# Patient Record
Sex: Male | Born: 1945 | Race: White | Hispanic: No | Marital: Married | Smoking: Never smoker
Health system: Southern US, Community
[De-identification: ages and names within clinical notes are randomized; demographics above are authoritative.]

## PROBLEM LIST (undated history)

## (undated) DIAGNOSIS — H269 Unspecified cataract: Secondary | ICD-10-CM

## (undated) DIAGNOSIS — L723 Sebaceous cyst: Secondary | ICD-10-CM

## (undated) DIAGNOSIS — C801 Malignant (primary) neoplasm, unspecified: Secondary | ICD-10-CM

## (undated) HISTORY — DX: Malignant (primary) neoplasm, unspecified: C80.1

## (undated) HISTORY — PX: JOINT REPLACEMENT: SHX530

## (undated) HISTORY — DX: Unspecified cataract: H26.9

## (undated) HISTORY — PX: VASECTOMY: SHX75

## (undated) HISTORY — PX: QUADRICEPS REPAIR: SHX2281

## (undated) HISTORY — DX: Sebaceous cyst: L72.3

## (undated) HISTORY — PX: EYE SURGERY: SHX253

## (undated) HISTORY — PX: MOUTH SURGERY: SHX715

---

## 1992-04-22 HISTORY — PX: HERNIA REPAIR: SHX51

## 1993-04-22 HISTORY — PX: BUNIONECTOMY: SHX129

## 1997-08-11 ENCOUNTER — Ambulatory Visit (HOSPITAL_COMMUNITY): Admission: RE | Admit: 1997-08-11 | Discharge: 1997-08-11 | Payer: Self-pay | Admitting: Gastroenterology

## 2004-07-05 ENCOUNTER — Observation Stay (HOSPITAL_COMMUNITY): Admission: RE | Admit: 2004-07-05 | Discharge: 2004-07-06 | Payer: Self-pay | Admitting: Orthopedic Surgery

## 2008-04-22 HISTORY — PX: KNEE SURGERY: SHX244

## 2009-04-22 HISTORY — PX: OTHER SURGICAL HISTORY: SHX169

## 2009-05-04 ENCOUNTER — Ambulatory Visit (HOSPITAL_BASED_OUTPATIENT_CLINIC_OR_DEPARTMENT_OTHER): Admission: RE | Admit: 2009-05-04 | Discharge: 2009-05-04 | Payer: Self-pay | Admitting: Orthopedic Surgery

## 2010-07-08 LAB — POCT HEMOGLOBIN-HEMACUE: Hemoglobin: 15.1 g/dL (ref 13.0–17.0)
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
## 2010-12-07 ENCOUNTER — Other Ambulatory Visit: Payer: Self-pay | Admitting: Family Medicine

## 2010-12-07 DIAGNOSIS — R1031 Right lower quadrant pain: Secondary | ICD-10-CM

## 2010-12-28 ENCOUNTER — Ambulatory Visit (INDEPENDENT_AMBULATORY_CARE_PROVIDER_SITE_OTHER): Payer: Self-pay | Admitting: Surgery

## 2011-01-17 ENCOUNTER — Encounter (INDEPENDENT_AMBULATORY_CARE_PROVIDER_SITE_OTHER): Payer: Self-pay | Admitting: Surgery

## 2011-01-30 ENCOUNTER — Encounter (INDEPENDENT_AMBULATORY_CARE_PROVIDER_SITE_OTHER): Payer: Self-pay | Admitting: General Surgery

## 2011-01-30 ENCOUNTER — Ambulatory Visit (INDEPENDENT_AMBULATORY_CARE_PROVIDER_SITE_OTHER): Payer: Medicare Other | Admitting: General Surgery

## 2011-01-30 VITALS — BP 108/66 | HR 60 | Temp 97.6°F | Resp 12 | Ht 76.0 in | Wt 219.0 lb

## 2011-01-30 DIAGNOSIS — L723 Sebaceous cyst: Secondary | ICD-10-CM

## 2011-01-30 NOTE — Patient Instructions (Signed)
Call [phone] and ask for scheduling 

## 2011-01-30 NOTE — Progress Notes (Signed)
Subjective:     Patient ID: Gordon Ellis, male   DOB: 05/03/1945, 65 y.o.   MRN: [id number]  HPI Patient presents complaining of an enlarging mass above left elbow. He's had it for some time. He does have a history of other cysts around his body that have been removed in the past. This includes right thigh and other areas. He has some mild localized pain especially with sleeping. There were no overlying skin changes or drainage.  Review of Systems  Constitutional: Negative.   HENT: Negative.   Eyes: Negative.   Respiratory: Negative.   Genitourinary: Negative for difficulty urinating.  Musculoskeletal: Negative.   Neurological: Negative.   Hematological: Negative.   Psychiatric/Behavioral: Negative.   Genitourinary additions: Prostate cancer     Objective:   Physical Exam  Constitutional: He is oriented to person, place, and time. He appears well-developed and well-nourished.  HENT:  Head: Normocephalic and atraumatic.  Eyes: Conjunctivae are normal. Pupils are equal, round, and reactive to light. Right eye exhibits no discharge.  Neck: Normal range of motion. Neck supple. No tracheal deviation present.  Cardiovascular: Normal rate, regular rhythm and normal heart sounds.   No murmur heard. Pulmonary/Chest: Effort normal and breath sounds normal. No respiratory distress. He has no wheezes. He has no rales.  Abdominal: Soft. He exhibits no distension. There is no tenderness. There is no rebound and no guarding.  Musculoskeletal: Normal range of motion.  Neurological: He is alert and oriented to person, place, and time.  Left arm: Patient has a 2.5 cm subcutaneous mass proximal to his elbow. It is freely mobile from underlying tissues. However it is firm. There is no surrounding nodularity. There is no overlying skin change.     Assessment:     Mass left arm, likely sebaceous cyst    Plan:     I have offered elective removal under MAC anesthesia as an outpatient procedure.  Procedure risks & benefits were discussed in detail with the patient. I advised him he'll need to [employer] for 5 days before surgery. He'll also need to avoid heavy lifting for 2 weeks after surgery. He needs to complete a project and would like to call for  scheduling. We look forward to proceeding in the near future.

## 2012-04-16 ENCOUNTER — Encounter: Payer: Self-pay | Admitting: *Deleted

## 2012-04-16 DIAGNOSIS — C4491 Basal cell carcinoma of skin, unspecified: Secondary | ICD-10-CM

## 2012-07-08 ENCOUNTER — Telehealth: Payer: Self-pay

## 2012-07-08 NOTE — Telephone Encounter (Signed)
Dot- (anesthesia nurse)  from the [facility] has called to request last ov notes and any recent EKG's on Gordon Ellis dob: 2046/03/26. She said he is having surgery on Monday and needs these before then to review. Fax number is: [phone] Attn: Eunice Blase or Stanton Kidney.  [facility] phone number: [phone]

## 2012-07-09 NOTE — Telephone Encounter (Signed)
Patient last seen in 2012. Records faxed with confirmation.

## 2012-07-09 NOTE — Telephone Encounter (Signed)
Called again for same thing.  Debbies ext is [phone]

## 2012-12-22 ENCOUNTER — Ambulatory Visit (INDEPENDENT_AMBULATORY_CARE_PROVIDER_SITE_OTHER): Payer: Medicare Other | Admitting: Internal Medicine

## 2012-12-22 ENCOUNTER — Ambulatory Visit: Payer: Medicare Other

## 2012-12-22 VITALS — BP 120/70 | HR 65 | Temp 98.0°F | Resp 18 | Ht 77.0 in | Wt 220.0 lb

## 2012-12-22 DIAGNOSIS — Z23 Encounter for immunization: Secondary | ICD-10-CM

## 2012-12-22 DIAGNOSIS — M25559 Pain in unspecified hip: Secondary | ICD-10-CM

## 2012-12-22 DIAGNOSIS — S5001XA Contusion of right elbow, initial encounter: Secondary | ICD-10-CM

## 2012-12-22 DIAGNOSIS — S60229A Contusion of unspecified hand, initial encounter: Secondary | ICD-10-CM

## 2012-12-22 DIAGNOSIS — S60221A Contusion of right hand, initial encounter: Secondary | ICD-10-CM

## 2012-12-22 DIAGNOSIS — S79911A Unspecified injury of right hip, initial encounter: Secondary | ICD-10-CM

## 2012-12-22 DIAGNOSIS — M25549 Pain in joints of unspecified hand: Secondary | ICD-10-CM

## 2012-12-22 DIAGNOSIS — S51009A Unspecified open wound of unspecified elbow, initial encounter: Secondary | ICD-10-CM

## 2012-12-22 DIAGNOSIS — S5000XA Contusion of unspecified elbow, initial encounter: Secondary | ICD-10-CM

## 2012-12-22 DIAGNOSIS — S51002A Unspecified open wound of left elbow, initial encounter: Secondary | ICD-10-CM

## 2012-12-22 MED ORDER — AMOXICILLIN-POT CLAVULANATE 875-125 MG PO TABS: 1.0000 | ORAL_TABLET | Freq: Two times a day (BID) | ORAL | Status: DC

## 2012-12-22 MED ORDER — HYDROCODONE-ACETAMINOPHEN 5-325 MG PO TABS: 1.0000 | ORAL_TABLET | Freq: Four times a day (QID) | ORAL | Status: DC | PRN

## 2012-12-22 NOTE — Progress Notes (Signed)
Procedure Note: Verbal consent obtained from the patient.  Local anesthesia with 3 cc Lidocaine 2% with epinephrine.  Wound scrubbed with soap and water and then irrigated with a normal saline by 60cc syringe.  The wound was contaminated with grass and dirt but was cleaned thoroughly.  Wound explored.  No deep structure injury noted.  Wound closed with #2 deep sutures of 4-0 vicryl and #6 simple interrupted sutures of 4-0 ethilon.  The lateral most portion of the wound is deeply abraded and the edges cannot be approximated.  Xeroform gauze applied to this area.  Wound cleansed and dressed.  Discussed wound care.  Will have pt follow up with me in 48 hours, fast track card given.  Anticipate suture removal in 7-10 days.
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
## 2012-12-22 NOTE — Patient Instructions (Signed)

## 2012-12-24 ENCOUNTER — Ambulatory Visit (INDEPENDENT_AMBULATORY_CARE_PROVIDER_SITE_OTHER): Payer: Medicare Other | Admitting: Physician Assistant

## 2012-12-24 VITALS — BP 112/60 | HR 66 | Temp 98.5°F | Resp 18 | Wt 227.0 lb

## 2012-12-24 DIAGNOSIS — Z5189 Encounter for other specified aftercare: Secondary | ICD-10-CM

## 2012-12-24 DIAGNOSIS — S51001D Unspecified open wound of right elbow, subsequent encounter: Secondary | ICD-10-CM

## 2012-12-24 NOTE — Progress Notes (Signed)
  Subjective:    Patient ID: Ronn Melena, male    DOB: 12-23-1945, 67 y.o.   MRN: [id number]  HPI   Mr. Acklin is a very pleasant 67 yr old male here for wound check.  See procedure note from 12/22/12.  Pt reports the elbow is felling much better.  Still with some soreness and swelling.  Using ace wrap.  Iced yesterday but hasn't iced today yet   Review of Systems  All other systems reviewed and are negative.       Objective:   Physical Exam  Vitals reviewed. Constitutional: He is oriented to person, place, and time. He appears well-developed and well-nourished. No distress.  HENT:  Head: Normocephalic and atraumatic.  Pulmonary/Chest: Effort normal.  Neurological: He is alert and oriented to person, place, and time.  Skin: Skin is warm and dry.  Wound at right elbow much improved.  Sutures in place.  Abraded skin surrounds laceration. Some drainage on bandage.  Gently cleansed area.  Pulled suture tails out of epithelializing tissue.  Xeroform in place.  Wound edges well approximated.  Some maceration around edges of abrasion  Psychiatric: He has a normal mood and affect. His behavior is normal.        Assessment & Plan:  Wound, open, elbow, right, subsequent encounter   Mr. Mcglocklin is a very pleasant 67 yr old male here for wound check.  The wound looks very good today.  Encouraged pt to change dressing twice daily so that some of the maceration will resolve.  Apply wet cloth to xeroform twice daily to help loosen this.  Keep area covered.  Anticipate suture removal in about 7 days.  Will have pt follow up with me 01/01/13 - sooner if concerns.  Continue abx to cover for infection as wound was quite dirty.

## 2013-01-01 ENCOUNTER — Ambulatory Visit (INDEPENDENT_AMBULATORY_CARE_PROVIDER_SITE_OTHER): Payer: Medicare Other | Admitting: Physician Assistant

## 2013-01-01 VITALS — BP 110/60 | HR 68 | Temp 98.6°F | Resp 18 | Ht 77.0 in | Wt 220.0 lb

## 2013-01-01 DIAGNOSIS — Z4802 Encounter for removal of sutures: Secondary | ICD-10-CM

## 2013-01-01 NOTE — Progress Notes (Signed)
  Subjective:    Patient ID: Gordon Ellis, male    DOB: [birth year], 67 y.o.   MRN: [id number]  Suture / Staple Removal     Gordon Ellis is a very pleasant 67 yr old male here for removal of sutures placed here 10 days ago.  See previous notes for details.  Pt states he continues to improve.  Some tenderness of the area still.  Xeroform has been removed.  He has one dose of abx to complete.  Denies bleeding or drainage from the wound.  Continues to change the dressing BID.  Review of Systems  Skin: Positive for wound.  All other systems reviewed and are negative.       Objective:   Physical Exam  Vitals reviewed. Constitutional: He is oriented to person, place, and time. He appears well-developed and well-nourished.  HENT:  Head: Normocephalic and atraumatic.  Pulmonary/Chest: Effort normal.  Neurological: He is alert and oriented to person, place, and time.  Skin: Skin is warm and dry.     Healing wound at right elbow; sutures intact though some covered in scab/epithelial tissue; sutures soaked and tails gently removed;  Sutures removed with minimal difficulty; steri strips applied for extra support; abraded area healing well though still not completely scabbed over  Psychiatric: He has a normal mood and affect. His behavior is normal.        Assessment & Plan:  Visit for suture removal   Gordon Ellis is a very pleasant 67 yr old male here for suture removal.  Sutures removed as above.  Wound continues to heal very well, though I anticipate it will be several more weeks until completely healed due to extensive abraded skin.  Steristrips applied where sutures removed to provide extra support.  Continue daily non-stick dressing changes.  May use small amount of vaseline on gauze to prevent sticking.  Finish abx as directed.  Follow up if needed.

## 2013-04-08 ENCOUNTER — Ambulatory Visit (INDEPENDENT_AMBULATORY_CARE_PROVIDER_SITE_OTHER): Payer: Medicare Other | Admitting: Family Medicine

## 2013-04-08 ENCOUNTER — Encounter: Payer: Self-pay | Admitting: Family Medicine

## 2013-04-08 VITALS — BP 112/59 | HR 81 | Temp 97.6°F | Resp 16 | Ht 76.0 in | Wt 221.0 lb

## 2013-04-08 DIAGNOSIS — Z0000 Encounter for general adult medical examination without abnormal findings: Secondary | ICD-10-CM

## 2013-04-08 DIAGNOSIS — J309 Allergic rhinitis, unspecified: Secondary | ICD-10-CM

## 2013-04-08 DIAGNOSIS — Z9849 Cataract extraction status, unspecified eye: Secondary | ICD-10-CM

## 2013-04-08 DIAGNOSIS — R6889 Other general symptoms and signs: Secondary | ICD-10-CM

## 2013-04-08 DIAGNOSIS — C61 Malignant neoplasm of prostate: Secondary | ICD-10-CM

## 2013-04-08 LAB — COMPREHENSIVE METABOLIC PANEL
ALT: 18 U/L (ref 0–53)
AST: 23 U/L (ref 0–37)
Albumin: 4.3 g/dL (ref 3.5–5.2)
Alkaline Phosphatase: 57 U/L (ref 39–117)
BUN: 11 mg/dL (ref 6–23)
CO2: 28 mEq/L (ref 19–32)
Calcium: 9 mg/dL (ref 8.4–10.5)
Chloride: 102 mEq/L (ref 96–112)
Creat: 0.68 mg/dL (ref 0.50–1.35)
Glucose, Bld: 90 mg/dL (ref 70–99)
Potassium: 4.3 mEq/L (ref 3.5–5.3)
Sodium: 135 mEq/L (ref 135–145)
Total Bilirubin: 0.7 mg/dL (ref 0.3–1.2)
Total Protein: 6 g/dL (ref 6.0–8.3)

## 2013-04-08 LAB — LIPID PANEL
Cholesterol: 185 mg/dL (ref 0–200)
HDL: 63 mg/dL (ref >39)
LDL Cholesterol: 107 mg/dL — ABNORMAL HIGH (ref 0–99)
Total CHOL/HDL Ratio: 2.9 Ratio
Triglycerides: 73 mg/dL (ref <150)
VLDL: 15 mg/dL (ref 0–40)

## 2013-04-08 LAB — CBC
HCT: 39.9 % (ref 39.0–52.0)
Hemoglobin: 13.4 g/dL (ref 13.0–17.0)
MCH: 30.2 pg (ref 26.0–34.0)
MCHC: 33.6 g/dL (ref 30.0–36.0)
MCV: 90.1 fL (ref 78.0–100.0)
Platelets: 187 10*3/uL (ref 150–400)
RBC: 4.43 MIL/uL (ref 4.22–5.81)
RDW: 13.5 % (ref 11.5–15.5)
WBC: 4.7 10*3/uL (ref 4.0–10.5)

## 2013-04-08 LAB — TSH: TSH: 1.66 u[IU]/mL (ref 0.350–4.500)

## 2013-04-08 MED ORDER — FLUTICASONE PROPIONATE 50 MCG/ACT NA SUSP: 2.0000 | Freq: Every day | NASAL | Status: DC

## 2013-04-08 NOTE — Patient Instructions (Addendum)
Elastic Therapy. [phone],  (or [phone]) Medium Compression 20-30  Open toe or closed toe  For twitching legs Try Magnesium supplement at night If this doesn't work, you can often get relief with iron supplement (used for restless legs) 325 mg once a day If that doesn't work, let me know and I can write a prescription.
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
## 2013-04-09 LAB — VITAMIN D 25 HYDROXY (VIT D DEFICIENCY, FRACTURES): Vit D, 25-Hydroxy: 36 ng/mL (ref 30–89)

## 2013-09-01 ENCOUNTER — Telehealth: Payer: Self-pay

## 2013-09-01 NOTE — Telephone Encounter (Addendum)
Patient called and left a message 08/31/13 requesting a copy of his lab results from his CPE done on 04/08/13. I returned call 09/01/13 and advised him that he can pick up his records now that I have completed the release and sign a release form when he comes to get them.

## 2014-03-21 ENCOUNTER — Ambulatory Visit: Payer: 59

## 2014-03-21 ENCOUNTER — Ambulatory Visit (INDEPENDENT_AMBULATORY_CARE_PROVIDER_SITE_OTHER): Payer: 59

## 2014-03-21 DIAGNOSIS — Z23 Encounter for immunization: Secondary | ICD-10-CM

## 2014-03-29 ENCOUNTER — Encounter: Payer: Self-pay | Admitting: *Deleted

## 2014-03-31 ENCOUNTER — Telehealth: Payer: Self-pay | Admitting: *Deleted

## 2014-03-31 NOTE — Telephone Encounter (Signed)
LMOM for pt to come in to receive flu shot.

## 2014-05-02 ENCOUNTER — Telehealth: Payer: Self-pay | Admitting: *Deleted

## 2014-05-02 NOTE — Telephone Encounter (Signed)
Patient received flu shot on 03/21/14 in the office.

## 2014-05-05 ENCOUNTER — Ambulatory Visit (INDEPENDENT_AMBULATORY_CARE_PROVIDER_SITE_OTHER): Payer: Medicare Other | Admitting: Family Medicine

## 2014-05-05 ENCOUNTER — Encounter: Payer: Self-pay | Admitting: Family Medicine

## 2014-05-05 VITALS — BP 109/65 | HR 65 | Temp 98.5°F | Resp 16 | Ht 76.0 in | Wt 217.0 lb

## 2014-05-05 DIAGNOSIS — Z0000 Encounter for general adult medical examination without abnormal findings: Secondary | ICD-10-CM

## 2014-05-05 DIAGNOSIS — H53459 Other localized visual field defect, unspecified eye: Secondary | ICD-10-CM

## 2014-05-05 DIAGNOSIS — J3089 Other allergic rhinitis: Secondary | ICD-10-CM

## 2014-05-05 DIAGNOSIS — Z23 Encounter for immunization: Secondary | ICD-10-CM

## 2014-05-05 DIAGNOSIS — R6889 Other general symptoms and signs: Secondary | ICD-10-CM

## 2014-05-05 DIAGNOSIS — H53419 Scotoma involving central area, unspecified eye: Secondary | ICD-10-CM

## 2014-05-05 LAB — COMPREHENSIVE METABOLIC PANEL
ALT: 19 U/L (ref 0–53)
AST: 23 U/L (ref 0–37)
Albumin: 4.2 g/dL (ref 3.5–5.2)
Alkaline Phosphatase: 48 U/L (ref 39–117)
BUN: 12 mg/dL (ref 6–23)
CO2: 28 mEq/L (ref 19–32)
Calcium: 8.9 mg/dL (ref 8.4–10.5)
Chloride: 102 mEq/L (ref 96–112)
Creat: 0.63 mg/dL (ref 0.50–1.35)
Glucose, Bld: 85 mg/dL (ref 70–99)
Potassium: 4.5 mEq/L (ref 3.5–5.3)
Sodium: 137 mEq/L (ref 135–145)
Total Bilirubin: 0.7 mg/dL (ref 0.2–1.2)
Total Protein: 6.5 g/dL (ref 6.0–8.3)

## 2014-05-05 LAB — POCT URINALYSIS DIPSTICK
Bilirubin, UA: NEGATIVE
Blood, UA: NEGATIVE
Glucose, UA: NEGATIVE
Ketones, UA: NEGATIVE
Leukocytes, UA: NEGATIVE
Nitrite, UA: NEGATIVE
Protein, UA: NEGATIVE
Spec Grav, UA: 1.015
Urobilinogen, UA: 0.2
pH, UA: 7.5

## 2014-05-05 LAB — LIPID PANEL
Cholesterol: 205 mg/dL — ABNORMAL HIGH (ref 0–200)
HDL: 63 mg/dL (ref >39)
LDL Cholesterol: 124 mg/dL — ABNORMAL HIGH (ref 0–99)
Total CHOL/HDL Ratio: 3.3 Ratio
Triglycerides: 88 mg/dL (ref <150)
VLDL: 18 mg/dL (ref 0–40)

## 2014-05-05 MED ORDER — PNEUMOCOCCAL 13-VAL CONJ VACC IM SUSP: 0.5000 mL | INTRAMUSCULAR | Status: DC

## 2014-05-05 MED ORDER — IPRATROPIUM BROMIDE 0.03 % NA SOLN: 2.0000 | Freq: Two times a day (BID) | NASAL | Status: DC

## 2014-05-05 NOTE — Patient Instructions (Signed)
Elastics therapy  [id number]   Health Maintenance A healthy lifestyle and preventative care can promote health and wellness.  Maintain regular health, dental, and eye exams.  Eat a healthy diet. Foods like vegetables, fruits, whole grains, low-fat dairy products, and lean protein foods contain the nutrients you need and are low in calories. Decrease your intake of foods high in solid fats, added sugars, and salt. Get information about a proper diet from your health care provider, if necessary.  Regular physical exercise is one of the most important things you can do for your health. Most adults should get at least 150 minutes of moderate-intensity exercise (any activity that increases your heart rate and causes you to sweat) each week. In addition, most adults need muscle-strengthening exercises on 2 or more days a week.   Maintain a healthy weight. The body mass index (BMI) is a screening tool to identify possible weight problems. It provides an estimate of body fat based on height and weight. Your health care provider can find your BMI and can help you achieve or maintain a healthy weight. For males 20 years and older:  A BMI below 18.5 is considered underweight.  A BMI of 18.5 to 24.9 is normal.  A BMI of 25 to 29.9 is considered overweight.  A BMI of 30 and above is considered obese.  Maintain normal blood lipids and cholesterol by exercising and minimizing your intake of saturated fat. Eat a balanced diet with plenty of fruits and vegetables. Blood tests for lipids and cholesterol should begin at age 75 and be repeated every 5 years. If your lipid or cholesterol levels are high, you are over age 64, or you are at high risk for heart disease, you may need your cholesterol levels checked more frequently.Ongoing high lipid and cholesterol levels should be treated with medicines if diet and exercise are not working.  If you smoke, find out from your health care provider how to quit. If you  do not use tobacco, do not start.  Lung cancer screening is recommended for adults aged 59-80 years who are at high risk for developing lung cancer because of a history of smoking. A yearly low-dose CT scan of the lungs is recommended for people who have at least a 30-pack-year history of smoking and are current smokers or have quit within the past 15 years. A pack year of smoking is smoking an average of 1 pack of cigarettes a day for 1 year (for example, a 30-pack-year history of smoking could mean smoking 1 pack a day for 30 years or 2 packs a day for 15 years). Yearly screening should continue until the smoker has stopped smoking for at least 15 years. Yearly screening should be stopped for people who develop a health problem that would prevent them from having lung cancer treatment.  If you choose to drink alcohol, do not have more than 2 drinks per day. One drink is considered to be 12 oz (360 mL) of beer, 5 oz (150 mL) of wine, or 1.5 oz (45 mL) of liquor.  Avoid the use of street drugs. Do not share needles with anyone. Ask for help if you need support or instructions about stopping the use of drugs.  High blood pressure causes heart disease and increases the risk of stroke. Blood pressure should be checked at least every 1-2 years. Ongoing high blood pressure should be treated with medicines if weight loss and exercise are not effective.  If you are 96-58 years old,  ask your health care provider if you should take aspirin to prevent heart disease.  Diabetes screening involves taking a blood sample to check your fasting blood sugar level. This should be done once every 3 years after age 45 if you are at a normal weight and without risk factors for diabetes. Testing should be considered at a younger age or be carried out more frequently if you are overweight and have at least 1 risk factor for diabetes.  Colorectal cancer can be detected and often prevented. Most routine colorectal cancer  screening begins at the age of 50 and continues through age 75. However, your health care provider may recommend screening at an earlier age if you have risk factors for colon cancer. On a yearly basis, your health care provider may provide home test kits to check for hidden blood in the stool. A small camera at the end of a tube may be used to directly examine the colon (sigmoidoscopy or colonoscopy) to detect the earliest forms of colorectal cancer. Talk to your health care provider about this at age 50 when routine screening begins. A direct exam of the colon should be repeated every 5-10 years through age 75, unless early forms of precancerous polyps or small growths are found.  People who are at an increased risk for hepatitis B should be screened for this virus. You are considered at high risk for hepatitis B if:  You were born in a country where hepatitis B occurs often. Talk with your health care provider about which countries are considered high risk.  Your parents were born in a high-risk country and you have not received a shot to protect against hepatitis B (hepatitis B vaccine).  You have HIV or AIDS.  You use needles to inject street drugs.  You live with, or have sex with, someone who has hepatitis B.  You are a man who has sex with other men (MSM).  You get hemodialysis treatment.  You take certain medicines for conditions like cancer, organ transplantation, and autoimmune conditions.  Hepatitis C blood testing is recommended for all people born from 1945 through 1965 and any individual with known risk factors for hepatitis C.  Healthy men should no longer receive prostate-specific antigen (PSA) blood tests as part of routine cancer screening. Talk to your health care provider about prostate cancer screening.  Testicular cancer screening is not recommended for adolescents or adult males who have no symptoms. Screening includes self-exam, a health care provider exam, and other  screening tests. Consult with your health care provider about any symptoms you have or any concerns you have about testicular cancer.  Practice safe sex. Use condoms and avoid high-risk sexual practices to reduce the spread of sexually transmitted infections (STIs).  You should be screened for STIs, including gonorrhea and chlamydia if:  You are sexually active and are younger than 24 years.  You are older than 24 years, and your health care provider tells you that you are at risk for this type of infection.  Your sexual activity has changed since you were last screened, and you are at an increased risk for chlamydia or gonorrhea. Ask your health care provider if you are at risk.  If you are at risk of being infected with HIV, it is recommended that you take a prescription medicine daily to prevent HIV infection. This is called pre-exposure prophylaxis (PrEP). You are considered at risk if:  You are a man who has sex with other men (MSM).    You are a heterosexual man who is sexually active with multiple partners.  You take drugs by injection.  You are sexually active with a partner who has HIV.  Talk with your health care provider about whether you are at high risk of being infected with HIV. If you choose to begin PrEP, you should first be tested for HIV. You should then be tested every 3 months for as long as you are taking PrEP.  Use sunscreen. Apply sunscreen liberally and repeatedly throughout the day. You should seek shade when your shadow is shorter than you. Protect yourself by wearing long sleeves, pants, a wide-brimmed hat, and sunglasses year round whenever you are outdoors.  Tell your health care provider of new moles or changes in moles, especially if there is a change in shape or color. Also, tell your health care provider if a mole is larger than the size of a pencil eraser.  A one-time screening for abdominal aortic aneurysm (AAA) and surgical repair of large AAAs by  ultrasound is recommended for men aged 19-75 years who are current or former smokers.  Stay current with your vaccines (immunizations). Document Released: 10/05/2007 Document Revised: 04/13/2013 Document Reviewed: 09/03/2010 [facility] Patient Information 2015 [place], [place]. This information is not intended to replace advice given to you by your health care provider. Make sure you discuss any questions you have with your health care provider.

## 2014-05-05 NOTE — Progress Notes (Addendum)
Subjective:  This chart was scribed for [name], MD by Donato Schultz, Medical Scribe. This patient was seen in Room 26 and the patient's care was started at 2:32 PM.   Patient ID: Gordon Ellis, male    DOB: 12-Aug-1945, 69 y.o.   MRN: [id number]  HPI HPI Comments: Gordon Ellis is a 69 y.o. male who presents to the [facility] for an annual exam.  He is complaining of cold intolerance.  He is also experiencing intermittent lightheadedness that occurs when he is sitting down watching television.  He states that his vision becomes pixilated during these episodes.  He is also complaining of constant rhinorrhea and nasal congestion.  He has tried using Flonase with no relief to his symptoms.  His PSA has been low.  His last prostate exam was in November and it was normal.  He sees a dermatologist every 6 months.  Dr. Baldwin Jamaica did his cataract surgery and lens implants.  He had a colonoscopy in 2009 and a Tdap in 2014.  He still ride shis bike for exercise.  He would like to receive the Prevnar vaccine.  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    Patient Active Problem List   Diagnosis Date Noted  . Cataract extraction status 04/08/2013  . Prostate ca 04/08/2013  . Basal cell carcinoma 04/16/2012   Past Medical History  Diagnosis Date  . Cancer     prostate  . Sebaceous cyst     left arm   Past Surgical History  Procedure Laterality Date  . Knee surgery  2010    right  . Thumb surgery  2011    left  . Mouth surgery    . Bunionectomy  1995    left  . Hernia repair  1994    [facility]   No Known Allergies Prior to Admission medications   Medication Sig Start Date End Date Taking? Authorizing Provider  finasteride (PROSCAR) 5 MG tablet daily. 01/28/11  Yes Historical Provider, MD  fluticasone (FLONASE) 50 MCG/ACT nasal spray Place 2 sprays into both nostrils daily. Patient not taking:  Reported on 05/05/2014 04/08/13   [name], MD   History   Social History  . Marital Status: Married    Spouse Name: N/A    Number of Children: N/A  . Years of Education: N/A   Occupational History  . Not on file.   Social History Main Topics  . Smoking status: Never Smoker   . Smokeless tobacco: Not on file  . Alcohol Use: Yes  . Drug Use: No  . Sexual Activity: Not on file   Other Topics Concern  . Not on file   Social History Narrative     Review of Systems  HENT: Positive for congestion and rhinorrhea.   Neurological: Positive for light-headedness.  All other systems reviewed and are negative.    Objective:  Physical Exam  Constitutional: He is oriented to person, place, and time. He appears well-developed and well-nourished.  HENT:  Head: Normocephalic and atraumatic.  Right Ear: External ear normal.  Left Ear: External ear normal.  Mouth/Throat: Oropharynx is clear and moist. No oropharyngeal exudate.  Eyes: Conjunctivae and EOM are normal. Pupils are equal, round, and reactive to light. Right eye exhibits no discharge. Left eye exhibits no discharge. No scleral icterus.  Neck: Normal range of motion. Neck supple. No thyromegaly present.  Cardiovascular:  Normal rate, regular rhythm and normal heart sounds.  Exam reveals no gallop and no friction rub.   No murmur heard. Pulmonary/Chest: Effort normal and breath sounds normal. No respiratory distress. He has no wheezes. He has no rales.  Abdominal: Soft. Bowel sounds are normal. He exhibits no distension and no mass. There is no tenderness. There is no rebound and no guarding.  Musculoskeletal: Normal range of motion.  Lymphadenopathy:    He has no cervical adenopathy.  Neurological: He is alert and oriented to person, place, and time.  Skin: Skin is warm and dry.  Psychiatric: He has a normal mood and affect. His behavior is normal.  Nursing note and vitals reviewed.  Skin:  Multiple seb keratoses 2  skin lesions:  Right scapula and left inner helix, cryo'd Results for orders placed or performed in visit on 05/05/14  POCT urinalysis dipstick  Result Value Ref Range   Color, UA YELLOW    Clarity, UA CLEAR    Glucose, UA NEG    Bilirubin, UA NEG    Ketones, UA NEG    Spec Grav, UA 1.015    Blood, UA NEG    pH, UA 7.5    Protein, UA NEG    Urobilinogen, UA 0.2    Nitrite, UA NEG    Leukocytes, UA Negative     BP 109/65 mmHg  Pulse 65  Temp(Src) 98.5 F (36.9 C)  Resp 16  Ht 6\' 4"  (1.93 m)  Wt 217 lb (98.431 kg)  BMI 26.43 kg/m2  SpO2 96% Assessment & Plan:   This chart was scribed in my presence and reviewed by me personally.    ICD-9-CM ICD-10-CM   1. Annual physical exam V70.0 Z00.00 CBC with Differential     TSH     Lipid panel     Comprehensive metabolic panel     POCT urinalysis dipstick     pneumococcal 13-valent conjugate vaccine (PREVNAR 13) injection 0.5 mL     Sedimentation rate     CANCELED: Vit D  25 hydroxy (rtn osteoporosis monitoring)  2. Other allergic rhinitis 477.8 J30.89 ipratropium (ATROVENT) 0.03 % nasal spray  3. Cold intolerance 780.99 R68.89 TSH  4. [place], unspecified laterality 368.44 H53.459 Sedimentation rate  5. Need for prophylactic vaccination against Streptococcus pneumoniae (pneumococcus) V03.82 Z23 Pneumococcal conjugate vaccine 13-valent IM    Signed, [name], MD

## 2014-05-05 NOTE — Progress Notes (Signed)
   Subjective:    Patient ID: Gordon Ellis, male    DOB: [birth year], 69 y.o.   MRN: [id number]  HPI    Review of Systems  HENT: Positive for congestion, postnasal drip and rhinorrhea.   Cardiovascular: Positive for leg swelling.  Endocrine: Positive for cold intolerance.  Genitourinary: Positive for frequency.  Neurological: Positive for light-headedness.  Psychiatric/Behavioral: The patient is nervous/anxious.        Objective:   Physical Exam        Assessment & Plan:

## 2014-05-06 LAB — CBC WITH DIFFERENTIAL/PLATELET
Basophils Absolute: 0 10*3/uL (ref 0.0–0.1)
Basophils Relative: 1 % (ref 0–1)
Eosinophils Absolute: 0.1 10*3/uL (ref 0.0–0.7)
Eosinophils Relative: 2 % (ref 0–5)
HCT: 43.7 % (ref 39.0–52.0)
Hemoglobin: 14.6 g/dL (ref 13.0–17.0)
Lymphocytes Relative: 38 % (ref 12–46)
Lymphs Abs: 1.7 10*3/uL (ref 0.7–4.0)
MCH: 31.1 pg (ref 26.0–34.0)
MCHC: 33.4 g/dL (ref 30.0–36.0)
MCV: 93.2 fL (ref 78.0–100.0)
MPV: 10.2 fL (ref 8.6–12.4)
Monocytes Absolute: 0.4 10*3/uL (ref 0.1–1.0)
Monocytes Relative: 8 % (ref 3–12)
Neutro Abs: 2.3 10*3/uL (ref 1.7–7.7)
Neutrophils Relative %: 51 % (ref 43–77)
Platelets: 183 10*3/uL (ref 150–400)
RBC: 4.69 MIL/uL (ref 4.22–5.81)
RDW: 13.7 % (ref 11.5–15.5)
WBC: 4.6 10*3/uL (ref 4.0–10.5)

## 2014-05-06 LAB — TSH: TSH: 1.401 u[IU]/mL (ref 0.350–4.500)

## 2014-05-07 LAB — SEDIMENTATION RATE: Sed Rate: 1 mm/hr (ref 0–16)

## 2014-10-05 DIAGNOSIS — N400 Enlarged prostate without lower urinary tract symptoms: Secondary | ICD-10-CM | POA: Insufficient documentation

## 2014-10-05 DIAGNOSIS — N529 Male erectile dysfunction, unspecified: Secondary | ICD-10-CM | POA: Insufficient documentation

## 2015-01-05 ENCOUNTER — Encounter (HOSPITAL_COMMUNITY): Payer: Self-pay | Admitting: *Deleted

## 2015-01-05 DIAGNOSIS — R3916 Straining to void: Secondary | ICD-10-CM

## 2015-01-05 DIAGNOSIS — C61 Malignant neoplasm of prostate: Secondary | ICD-10-CM

## 2015-01-05 DIAGNOSIS — N401 Enlarged prostate with lower urinary tract symptoms: Secondary | ICD-10-CM

## 2015-01-05 NOTE — Assessment & Plan Note (Signed)
[facility] Raynelle Bring, MD 10/05/14  No evidence of clinical progression.  Follow-up in 6 months.  We will then plan to schedule his next surveillance protocol biopsy shortly after his next appointment.

## 2015-09-05 ENCOUNTER — Telehealth: Payer: Self-pay | Admitting: Family Medicine

## 2015-09-05 NOTE — Telephone Encounter (Signed)
SPOKE WITH PATIENT AND HE WILL BE COMING IN ON 02/27/16 FOR A CPE WITH CHELLE JEFFERIES.

## 2015-09-05 NOTE — Telephone Encounter (Signed)
Left a message for patient to call and schedule a CPE

## 2015-11-09 ENCOUNTER — Other Ambulatory Visit: Payer: Self-pay | Admitting: Urology

## 2015-11-09 DIAGNOSIS — C61 Malignant neoplasm of prostate: Secondary | ICD-10-CM

## 2015-11-20 ENCOUNTER — Encounter: Payer: Self-pay | Admitting: Physician Assistant

## 2015-12-05 ENCOUNTER — Other Ambulatory Visit: Payer: Self-pay

## 2016-01-22 DIAGNOSIS — R351 Nocturia: Secondary | ICD-10-CM | POA: Insufficient documentation

## 2016-01-22 DIAGNOSIS — R3915 Urgency of urination: Secondary | ICD-10-CM | POA: Insufficient documentation

## 2016-02-27 ENCOUNTER — Ambulatory Visit (INDEPENDENT_AMBULATORY_CARE_PROVIDER_SITE_OTHER): Payer: Medicare Other | Admitting: Physician Assistant

## 2016-02-27 ENCOUNTER — Encounter: Payer: Self-pay | Admitting: Physician Assistant

## 2016-02-27 VITALS — BP 120/60 | HR 60 | Temp 98.0°F | Resp 16 | Ht 76.0 in | Wt 216.2 lb

## 2016-02-27 DIAGNOSIS — R6889 Other general symptoms and signs: Secondary | ICD-10-CM

## 2016-02-27 DIAGNOSIS — Z136 Encounter for screening for cardiovascular disorders: Secondary | ICD-10-CM

## 2016-02-27 DIAGNOSIS — C61 Malignant neoplasm of prostate: Secondary | ICD-10-CM

## 2016-02-27 DIAGNOSIS — Z0000 Encounter for general adult medical examination without abnormal findings: Secondary | ICD-10-CM | POA: Diagnosis not present

## 2016-02-27 DIAGNOSIS — Z23 Encounter for immunization: Secondary | ICD-10-CM

## 2016-02-27 DIAGNOSIS — N401 Enlarged prostate with lower urinary tract symptoms: Secondary | ICD-10-CM

## 2016-02-27 DIAGNOSIS — E785 Hyperlipidemia, unspecified: Secondary | ICD-10-CM

## 2016-02-27 DIAGNOSIS — Z1159 Encounter for screening for other viral diseases: Secondary | ICD-10-CM

## 2016-02-27 DIAGNOSIS — Z131 Encounter for screening for diabetes mellitus: Secondary | ICD-10-CM

## 2016-02-27 DIAGNOSIS — R351 Nocturia: Secondary | ICD-10-CM

## 2016-02-27 LAB — COMPREHENSIVE METABOLIC PANEL
ALK PHOS: 46 U/L (ref 40–115)
ALT: 20 U/L (ref 9–46)
AST: 26 U/L (ref 10–35)
Albumin: 4.4 g/dL (ref 3.6–5.1)
BILIRUBIN TOTAL: 0.7 mg/dL (ref 0.2–1.2)
BUN: 14 mg/dL (ref 7–25)
CO2: 27 mmol/L (ref 20–31)
CREATININE: 0.83 mg/dL (ref 0.70–1.18)
Calcium: 9.5 mg/dL (ref 8.6–10.3)
Chloride: 103 mmol/L (ref 98–110)
Glucose, Bld: 96 mg/dL (ref 65–99)
Potassium: 4.6 mmol/L (ref 3.5–5.3)
SODIUM: 138 mmol/L (ref 135–146)
TOTAL PROTEIN: 6.4 g/dL (ref 6.1–8.1)

## 2016-02-27 LAB — LIPID PANEL
CHOLESTEROL: 220 mg/dL — AB (ref <200)
HDL: 79 mg/dL (ref >40)
LDL Cholesterol: 124 mg/dL — ABNORMAL HIGH
Total CHOL/HDL Ratio: 2.8 Ratio (ref <5.0)
Triglycerides: 86 mg/dL (ref <150)
VLDL: 17 mg/dL (ref <30)

## 2016-02-27 LAB — CBC WITH DIFFERENTIAL/PLATELET
BASOS PCT: 1 %
Basophils Absolute: 58 cells/uL (ref 0–200)
EOS ABS: 58 {cells}/uL (ref 15–500)
Eosinophils Relative: 1 %
HEMATOCRIT: 42.3 % (ref 38.5–50.0)
Hemoglobin: 14.1 g/dL (ref 13.2–17.1)
LYMPHS ABS: 2378 {cells}/uL (ref 850–3900)
Lymphocytes Relative: 41 %
MCH: 31.2 pg (ref 27.0–33.0)
MCHC: 33.3 g/dL (ref 32.0–36.0)
MCV: 93.6 fL (ref 80.0–100.0)
MONO ABS: 580 {cells}/uL (ref 200–950)
MONOS PCT: 10 %
MPV: 10.2 fL (ref 7.5–12.5)
NEUTROS ABS: 2726 {cells}/uL (ref 1500–7800)
Neutrophils Relative %: 47 %
PLATELETS: 180 10*3/uL (ref 140–400)
RBC: 4.52 MIL/uL (ref 4.20–5.80)
RDW: 13.4 % (ref 11.0–15.0)
WBC: 5.8 10*3/uL (ref 3.8–10.8)

## 2016-02-27 LAB — TSH: TSH: 1.11 mIU/L (ref 0.40–4.50)

## 2016-02-27 NOTE — Patient Instructions (Addendum)
IF you received an x-ray today, you will receive an invoice from [facility]. Please contact [facility] at [phone] with questions or concerns regarding your invoice.   IF you received labwork today, you will receive an invoice from [employer]. Please contact [facility] at [phone] with questions or concerns regarding your invoice.   Our billing staff will not be able to assist you with questions regarding bills from these companies.  You will be contacted with the lab results as soon as they are available. The fastest way to get your results is to activate your My Chart account. Instructions are located on the last page of this paperwork. If you have not heard from [place] regarding the results in 2 weeks, please contact this office.     Keeping you healthy  Get these tests  Blood pressure- Have your blood pressure checked once a year by your healthcare provider.  Normal blood pressure is 120/80  Weight- Have your body mass index (BMI) calculated to screen for obesity.  BMI is a measure of body fat based on height and weight. You can also calculate your own BMI at ViewBanking.si.  Cholesterol- Have your cholesterol checked every year.  Diabetes- Have your blood sugar checked regularly if you have high blood pressure, high cholesterol, have a family history of diabetes or if you are overweight.  Screening for Colon Cancer- Colonoscopy starting at age 55.  Screening may begin sooner depending on your family history and other health conditions. Follow up colonoscopy as directed by your Gastroenterologist.  Screening for Prostate Cancer- Both blood work (PSA) and a rectal exam help screen for Prostate Cancer.  Screening begins at age 64 with African-American men and at age 6 with Caucasian men.  Screening may begin sooner depending on your family history.  Take these medicines  Aspirin- One aspirin daily can help prevent Heart  disease and Stroke.  Flu shot- Every fall.  Tetanus- Every 10 years.  Zostavax- Once after the age of 71 to prevent Shingles.  Pneumonia shot- Once after the age of 47; if you are younger than 70, ask your healthcare provider if you need a Pneumonia shot.  Take these steps  Don't smoke- If you do smoke, talk to your doctor about quitting.  For tips on how to quit, go to www.smokefree.gov or call 1-800-QUIT-NOW.  Be physically active- Exercise 5 days a week for at least 30 minutes.  If you are not already physically active start slow and gradually work up to 30 minutes of moderate physical activity.  Examples of moderate activity include walking briskly, mowing the yard, dancing, swimming, bicycling, etc.  Eat a healthy diet- Eat a variety of healthy food such as fruits, vegetables, low fat milk, low fat cheese, yogurt, lean meant, poultry, fish, beans, tofu, etc. For more information go to www.thenutritionsource.org  Drink alcohol in moderation- Limit alcohol intake to less than two drinks a day. Never drink and drive.  Dentist- Brush and floss twice daily; visit your dentist twice a year.  Depression- Your emotional health is as important as your physical health. If you're feeling down, or losing interest in things you would normally enjoy please talk to your healthcare provider.  Eye exam- Visit your eye doctor every year.  Safe sex- If you may be exposed to a sexually transmitted infection, use a condom.  Seat belts- Seat belts can save your life; always wear one.  Smoke/Carbon Monoxide detectors- These detectors need to be installed  on the appropriate level of your home.  Replace batteries at least once a year.  Skin cancer- When out in the sun, cover up and use sunscreen 15 SPF or higher.  Violence- If anyone is threatening you, please tell your healthcare provider.  Living Will/ Health care power of attorney- Speak with your healthcare provider and family.    Influenza  (Flu) Vaccine (Inactivated or Recombinant):  1. Why get vaccinated? Influenza ("flu") is a contagious disease that spreads around the [place] every year, usually between October and May. Flu is caused by influenza viruses, and is spread mainly by coughing, sneezing, and close contact. Anyone can get flu. Flu strikes suddenly and can last several days. Symptoms vary by age, but can include:  fever/chills  sore throat  muscle aches  fatigue  cough  headache  runny or stuffy nose Flu can also lead to pneumonia and blood infections, and cause diarrhea and seizures in children. If you have a medical condition, such as heart or lung disease, flu can make it worse. Flu is more dangerous for some people. Infants and young children, people 82 years of age and older, pregnant women, and people with certain health conditions or a weakened immune system are at greatest risk. Each year thousands of people in the [place] die from flu, and many more are hospitalized. Flu vaccine can:  keep you from getting flu,  make flu less severe if you do get it, and  keep you from spreading flu to your family and other people. 2. Inactivated and recombinant flu vaccines A dose of flu vaccine is recommended every flu season. Children 6 months through 97 years of age may need two doses during the same flu season. Everyone else needs only one dose each flu season. Some inactivated flu vaccines contain a very small amount of a mercury-based preservative called thimerosal. Studies have not shown thimerosal in vaccines to be harmful, but flu vaccines that do not contain thimerosal are available. There is no live flu virus in flu shots. They cannot cause the flu. There are many flu viruses, and they are always changing. Each year a new flu vaccine is made to protect against three or four viruses that are likely to cause disease in the upcoming flu season. But even when the vaccine doesn't exactly match  these viruses, it may still provide some protection. Flu vaccine cannot prevent:  flu that is caused by a virus not covered by the vaccine, or  illnesses that look like flu but are not. It takes about 2 weeks for protection to develop after vaccination, and protection lasts through the flu season. 3. Some people should not get this vaccine Tell the person who is giving you the vaccine:  If you have any severe, life-threatening allergies. If you ever had a life-threatening allergic reaction after a dose of flu vaccine, or have a severe allergy to any part of this vaccine, you may be advised not to get vaccinated. Most, but not all, types of flu vaccine contain a small amount of egg protein.  If you ever had Guillain-Barre Syndrome (also called GBS). Some people with a history of GBS should not get this vaccine. This should be discussed with your doctor.  If you are not feeling well. It is usually okay to get flu vaccine when you have a mild illness, but you might be asked to come back when you feel better. 4. Risks of a vaccine reaction With any medicine, including vaccines, there  is a chance of reactions. These are usually mild and go away on their own, but serious reactions are also possible. Most people who get a flu shot do not have any problems with it. Minor problems following a flu shot include:  soreness, redness, or swelling where the shot was given  hoarseness  sore, red or itchy eyes  cough  fever  aches  headache  itching  fatigue If these problems occur, they usually begin soon after the shot and last 1 or 2 days. More serious problems following a flu shot can include the following:  There may be a small increased risk of Guillain-Barre Syndrome (GBS) after inactivated flu vaccine. This risk has been estimated at 1 or 2 additional cases per million people vaccinated. This is much lower than the risk of severe complications from flu, which can be prevented by flu  vaccine.  Young children who get the flu shot along with pneumococcal vaccine (PCV13) and/or DTaP vaccine at the same time might be slightly more likely to have a seizure caused by fever. Ask your doctor for more information. Tell your doctor if a child who is getting flu vaccine has ever had a seizure. Problems that could happen after any injected vaccine:  People sometimes faint after a medical procedure, including vaccination. Sitting or lying down for about 15 minutes can help prevent fainting, and injuries caused by a fall. Tell your doctor if you feel dizzy, or have vision changes or ringing in the ears.  Some people get severe pain in the shoulder and have difficulty moving the arm where a shot was given. This happens very rarely.  Any medication can cause a severe allergic reaction. Such reactions from a vaccine are very rare, estimated at about 1 in a million doses, and would happen within a few minutes to a few hours after the vaccination. As with any medicine, there is a very remote chance of a vaccine causing a serious injury or death. The safety of vaccines is always being monitored. For more information, visit: http://www.aguilar.org/ 5. What if there is a serious reaction? What should I look for?  Look for anything that concerns you, such as signs of a severe allergic reaction, very high fever, or unusual behavior. Signs of a severe allergic reaction can include hives, swelling of the face and throat, difficulty breathing, a fast heartbeat, dizziness, and weakness. These would start a few minutes to a few hours after the vaccination. What should I do?  If you think it is a severe allergic reaction or other emergency that can't wait, call 9-1-1 and get the person to the nearest hospital. Otherwise, call your doctor.  Reactions should be reported to the Vaccine Adverse Event Reporting System (VAERS). Your doctor should file this report, or you can do it yourself through the VAERS  web site at www.vaers.SamedayNews.es, or by calling 502-798-6773. VAERS does not give medical advice. 6. The National Vaccine Injury Compensation Program The [employer] Vaccine Injury Compensation Program (VICP) is a federal program that was created to compensate people who may have been injured by certain vaccines. Persons who believe they may have been injured by a vaccine can learn about the program and about filing a claim by calling 9043217156 or visiting the [place] website at GoldCloset.com.ee. There is a time limit to file a claim for compensation. 7. How can I learn more?  Ask your healthcare provider. He or she can give you the vaccine package insert or suggest other sources of information.  Call your local or state health department.  Contact the Centers for Disease Control and Prevention (CDC):  Call (910)758-3485 (1-800-CDC-INFO) or  Visit CDC's website at https://gibson.com/ Vaccine Information Statement Inactivated Influenza Vaccine (11/26/2013)   This information is not intended to replace advice given to you by your health care provider. Make sure you discuss any questions you have with your health care provider.   Document Released: 01/31/2006 Document Revised: 04/29/2014 Document Reviewed: 11/29/2013 Elsevier Interactive Patient Education 2016 [employer].  Pneumococcal Vaccine, Polyvalent solution for injection What is this medicine? PNEUMOCOCCAL VACCINE, POLYVALENT (NEU mo KOK al vak SEEN, pol ee VEY luhnt) is a vaccine to prevent pneumococcus bacteria infection. These bacteria are a major cause of ear infections, Strep throat infections, and serious pneumonia, meningitis, or blood infections worldwide. These vaccines help the body to produce antibodies (protective substances) that help your body defend against these bacteria. This vaccine is recommended for people 73 years of age and older with health problems. It is also recommended for all adults over 81 years  old. This vaccine will not treat an infection. This medicine may be used for other purposes; ask your health care provider or pharmacist if you have questions. What should I tell my health care provider before I take this medicine? They need to know if you have any of these conditions: -bleeding problems -bone marrow or organ transplant -cancer, Hodgkin's disease -fever -infection -immune system problems -low platelet count in the blood -seizures -an unusual or allergic reaction to pneumococcal vaccine, diphtheria toxoid, other vaccines, latex, other medicines, foods, dyes, or preservatives -pregnant or trying to get pregnant -breast-feeding How should I use this medicine? This vaccine is for injection into a muscle or under the skin. It is given by a health care professional. A copy of Vaccine Information Statements will be given before each vaccination. Read this sheet carefully each time. The sheet may change frequently. Talk to your pediatrician regarding the use of this medicine in children. While this drug may be prescribed for children as young as 7 years of age for selected conditions, precautions do apply. Overdosage: If you think you have taken too much of this medicine contact a poison control center or emergency room at once. NOTE: This medicine is only for you. Do not share this medicine with others. What if I miss a dose? It is important not to miss your dose. Call your doctor or health care professional if you are unable to keep an appointment. What may interact with this medicine? -medicines for cancer chemotherapy -medicines that suppress your immune function -medicines that treat or prevent blood clots like warfarin, enoxaparin, and dalteparin -steroid medicines like prednisone or cortisone This list may not describe all possible interactions. Give your health care provider a list of all the medicines, herbs, non-prescription drugs, or dietary supplements you use. Also  tell them if you smoke, drink alcohol, or use illegal drugs. Some items may interact with your medicine. What should I watch for while using this medicine? Mild fever and pain should go away in 3 days or less. Report any unusual symptoms to your doctor or health care professional. What side effects may I notice from receiving this medicine? Side effects that you should report to your doctor or health care professional as soon as possible: -allergic reactions like skin rash, itching or hives, swelling of the face, lips, or tongue -breathing problems -confused -fever over 102 degrees F -pain, tingling, numbness in the hands or feet -seizures -unusual bleeding  or bruising -unusual muscle weakness Side effects that usually do not require medical attention (report to your doctor or health care professional if they continue or are bothersome): -aches and pains -diarrhea -fever of 102 degrees F or less -headache -irritable -loss of appetite -pain, tender at site where injected -trouble sleeping This list may not describe all possible side effects. Call your doctor for medical advice about side effects. You may report side effects to FDA at 1-800-FDA-1088. Where should I keep my medicine? This does not apply. This vaccine is given in a clinic, pharmacy, doctor's office, or other health care setting and will not be stored at home. NOTE: This sheet is a summary. It may not cover all possible information. If you have questions about this medicine, talk to your doctor, pharmacist, or health care provider.    2016, Elsevier/Gold Standard. (2007-11-13 14:32:37)

## 2016-02-27 NOTE — Progress Notes (Signed)
Subjective:    Patient ID: Gordon Ellis, male    DOB: 1945-08-27, 70 y.o.   MRN: [id number]  Patient Care Team: [name], MD as PCP - General (Family Medicine) Raynelle Bring, MD as Consulting Physician (Urology)   Chief Complaint  Patient presents with  . Annual Exam    flu shot    HPI Presents for annual exam, and to establish care, as his previous PCP has retired.  Colorectal Cancer Screening: 2009, normal. Repeat 03/2018 Prostate Cancer Screening: managed by urology, due to prostate cancer Bone Density Testing: not yet HIV Screening: not indicated STI Screening: very low risk Seasonal Influenza Vaccination: today Td/Tdap Vaccination: 12/2012 (Tdap) Pneumococcal Vaccination: Prevnar 13 given 04/2014. Needs Pneumovax 23 Zoster Vaccination: complete Dental exams: Q77months Eye exams: annually  Last screening for diabetes: 04/2014, normal Last screening for hyperlipidemia: patient has mild hyperlipidemia Screening for AAA: not yet  Advanced Directives: discussed. Not on file. Encouraged to bring them for scanning into the record.  Living situation: lives with his wife.  Current complaints include cold intolerance and chills and drooling while sleeping. States he started noticing the cold intolerance about 6-9 months ago and feels he is constantly cold and wrapped in a blanket while at home.   Also notes increased drooling at night which he started noticing a few months ago. States he often notices several yellow/brown areas of the saliva on his pillowcase in the mornings. Denies smoking, weight loss. Denies increased saliva during the day or recent dental work or mouth lesions. States he sees a dentists regularly and should be seeing them again next month.  Depression screen [facility] 2/9 02/27/2016 05/05/2014  Decreased Interest 0 0  Down, Depressed, Hopeless 0 0  PHQ - 2 Score 0 0     Functional Status Survey: Is the patient deaf or have difficulty hearing?:  No Does the patient have difficulty seeing, even when wearing glasses/contacts?: Yes ("starting to have problems with distance") Does the patient have difficulty concentrating, remembering, or making decisions?: No Does the patient have difficulty walking or climbing stairs?: No Does the patient have difficulty dressing or bathing?: No Does the patient have difficulty doing errands alone such as visiting a doctor's office or shopping?: No   Patient Active Problem List   Diagnosis Date Noted  . Nocturia 01/22/2016  . Urinary urgency 01/22/2016  . BPH (benign prostatic hyperplasia) 10/05/2014  . ED (erectile dysfunction) 10/05/2014  . Cataract extraction status 04/08/2013  . Prostate CA ([place]) 04/08/2013  . Basal cell carcinoma 04/16/2012    Past Medical History:  Diagnosis Date  . Cancer [facility])    prostate  . Cataract   . Sebaceous cyst    left arm    Past Surgical History:  Procedure Laterality Date  . [place]   left  . [place]  2010   right  . MOUTH SURGERY    . thumb surgery  2011   left    Family History  Problem Relation Age of Onset  . Cancer Mother     leukemia  . Heart disease Father   . Cancer Brother     prostate  . Hyperlipidemia Brother   . Cancer Paternal Grandfather     colon    Social History   Social History  . Marital status: Married    Spouse name: N/A  . Number of children: N/A  . Years of education: N/A  Occupational History  . Not on file.   Social History Main Topics  . Smoking status: Never Smoker  . Smokeless tobacco: Never Used  . Alcohol use 0.6 oz/week    1 Standard drinks or equivalent per week  . Drug use: No  . Sexual activity: Yes   Other Topics Concern  . Not on file   Social History Narrative   Retired, previously works for [employer] all over the country and [employer]      Review of Systems  Constitutional: Positive for chills.  HENT: Positive for  drooling and rhinorrhea.   Eyes: Negative.   Respiratory: Negative.   Cardiovascular: Negative.   Gastrointestinal: Negative.   Endocrine: Positive for cold intolerance.  Genitourinary: Positive for frequency and urgency.  Musculoskeletal: Negative.   Skin: Negative.   Allergic/Immunologic: Positive for environmental allergies.  Neurological: Negative.   Hematological: Negative.   Psychiatric/Behavioral: Negative.        Objective:   Physical Exam  Constitutional: He is oriented to person, place, and time. He appears well-developed and well-nourished. He is active and cooperative.  Non-toxic appearance. He does not have a sickly appearance. He does not appear ill. No distress.  BP 120/60   Pulse 60   Temp 98 F (36.7 C) (Oral)   Resp 16   Ht 6\' 4"  (1.93 m)   Wt 216 lb 3.2 oz (98.1 kg)   SpO2 95%   BMI 26.32 kg/m    HENT:  Head: Normocephalic and atraumatic.  Right Ear: Hearing, tympanic membrane, external ear and ear canal normal.  Left Ear: Hearing, tympanic membrane, external ear and ear canal normal.  Nose: Nose normal.  Mouth/Throat: Uvula is midline, oropharynx is clear and moist and mucous membranes are normal. He does not have dentures. No oral lesions. No trismus in the jaw. Normal dentition. No dental abscesses, uvula swelling, lacerations or dental caries.  Eyes: Conjunctivae, EOM and lids are normal. Pupils are equal, round, and reactive to light. Right eye exhibits no discharge. Left eye exhibits no discharge. No scleral icterus.  Fundoscopic exam:      The right eye shows no arteriolar narrowing, no AV nicking, no exudate, no hemorrhage and no papilledema.       The left eye shows no arteriolar narrowing, no AV nicking, no exudate, no hemorrhage and no papilledema.  Neck: Normal range of motion, full passive range of motion without pain and phonation normal. Neck supple. No spinous process tenderness and no muscular tenderness present. No neck rigidity. No  tracheal deviation, no edema, no erythema and normal range of motion present. No thyromegaly present.  Cardiovascular: Normal rate, regular rhythm, S1 normal, S2 normal, normal heart sounds, intact distal pulses and normal pulses.  Exam reveals no gallop and no friction rub.   No murmur heard. Pulmonary/Chest: Effort normal and breath sounds normal. No respiratory distress. He has no wheezes. He has no rales.  Abdominal: Soft. Normal appearance and bowel sounds are normal. He exhibits no distension and no mass. There is no hepatosplenomegaly. There is no tenderness. There is no rebound and no guarding. No hernia.  Musculoskeletal: Normal range of motion. He exhibits no edema or tenderness.       Cervical back: Normal. He exhibits normal range of motion, no tenderness, no bony tenderness, no swelling, no edema, no deformity, no laceration, no pain, no spasm and normal pulse.       Thoracic back: Normal.       Lumbar back: Normal.  Lymphadenopathy:       Head (right side): No submental, no submandibular, no tonsillar, no preauricular, no posterior auricular and no occipital adenopathy present.       Head (left side): No submental, no submandibular, no tonsillar, no preauricular, no posterior auricular and no occipital adenopathy present.    He has no cervical adenopathy.       Right: No supraclavicular adenopathy present.       Left: No supraclavicular adenopathy present.  Neurological: He is alert and oriented to person, place, and time. He has normal strength and normal reflexes. He displays no tremor. No cranial nerve deficit. He exhibits normal muscle tone. Coordination and gait normal.  Skin: Skin is warm, dry and intact. No abrasion, no ecchymosis, no laceration, no lesion and no rash noted. He is not diaphoretic. No cyanosis or erythema. No pallor. Nails show no clubbing.  Psychiatric: He has a normal mood and affect. His speech is normal and behavior is normal. Judgment and thought content  normal. Cognition and memory are normal.          Assessment & Plan:   1. Annual physical exam 2. Medicare annual wellness visit, subsequent Age appropriate anticipatory guidance provided. Discussed healthy lifestyle changes for risk reduction and management of chronic diseases, reviewed covered Medicare preventive services.  3. Needs flu shot - Flu Vaccine QUAD 36+ mos IM  4. Need for hepatitis C screening test - Hepatitis C antibody  5. Need for pneumococcal vaccination - Pneumococcal polysaccharide vaccine 23-valent greater than or equal to 2yo subcutaneous/IM  6. Screening for diabetes mellitus - Comprehensive metabolic panel  7. Screening for AAA (abdominal aortic aneurysm) - US ABDOMINAL AORTA SCREENING AAA; Future  8. Prostate CA [facility] Continue routine follow-up with urology. - CBC with Differential/Platelet  9. Benign prostatic hyperplasia with nocturia See #8  10. Hyperlipidemia, unspecified hyperlipidemia type Await labs. Adjust regimen as indicated by results. - Comprehensive metabolic panel - Lipid panel  11. Cold intolerance - TSH   Fara Chute, PA-C Physician Assistant-Certified [facility]

## 2016-02-27 NOTE — Progress Notes (Signed)
Subjective:    Patient ID: Gordon Ellis, male    DOB: May 22, 1945, 70 y.o.   MRN: [id number]  Chief Complaint  Patient presents with  . Annual Exam    flu shot   Patient Care Team: [name], MD as PCP - General (Family Medicine) Myrlene Broker, MD as Attending Physician (Urology)  Colorectal Cancer Screening: 04/01/2008 Prostate Cancer Screening: History of prostate cancer, followed by [facility] oncologist Seasonal Influenza Vaccination: Today (02/27/2016) Td/Tdap Vaccination: 12/22/2012 Pneumococcal Vaccination: First of series received 05/05/2014 Zoster Vaccination: 04/22/2013; interested in new one? Frequency of Dental evaluation: Q6 months Frequency of Eye evaluation: Yearly Hepatitis C Screening: Today   HPI: Presents for annual wellness exam. Current complaints include cold intolerance and chills and drooling while sleeping. States he started noticing the cold intolerance about 6-9 months ago and feels he is constantly cold and wrapped in a blanket while at home.   Also notes increased drooling at night which he started noticing a few months ago. States he often notices several yellow/brown areas of the saliva on his pillowcase in the mornings. Denies smoking, weight loss. Denies increased saliva during the day or recent dental work or mouth lesions. States he sees a dentists regularly and should be seeing them again next month.  History of prostate cancer which was first diagnosed 8 years ago after elevated PSA. States they did multiple biopsies at that time and 2 out of the 22 they obtained had malignant cells. Patient had been taking Finasteride for a few years to help control the urinary frequency and urgency but stopped taking it about 9 months ago. States he is followed by a [facility] urologist for this. Received MRI results this week which were normal for patient. Continues to have some urinary frequency and urgency and not on medication as patient feels he can manage  it on his own. Denies enuresis, hematuria, dysuria.  Exercises regularly by bike riding and doing exercise classes with city recreation here in [place].   Review of Systems  Constitutional: Positive for chills. Negative for activity change, appetite change, diaphoresis, fatigue, fever and unexpected weight change.  HENT: Positive for drooling and rhinorrhea. Negative for congestion, dental problem, ear discharge, ear pain, facial swelling, hearing loss, mouth sores, nosebleeds, postnasal drip, sinus pain, sinus pressure, sneezing, sore throat, tinnitus, trouble swallowing and voice change.   Eyes: Negative for photophobia, pain, discharge, redness, itching and visual disturbance.  Respiratory: Negative for apnea, cough, choking, chest tightness, shortness of breath, wheezing and stridor.   Cardiovascular: Negative for chest pain, palpitations and leg swelling.  Gastrointestinal: Negative for abdominal distention, abdominal pain, anal bleeding, blood in stool, constipation, diarrhea, nausea, rectal pain and vomiting.  Endocrine: Positive for cold intolerance and polyuria. Negative for heat intolerance, polydipsia and polyphagia.  Genitourinary: Positive for frequency and urgency. Negative for difficulty urinating, discharge, dysuria, enuresis, flank pain, genital sores, hematuria, penile pain, penile swelling, scrotal swelling and testicular pain.  Musculoskeletal: Negative for arthralgias, back pain, gait problem, joint swelling, myalgias, neck pain and neck stiffness.  Skin: Negative for color change, pallor, rash and wound.  Allergic/Immunologic: Positive for environmental allergies.  Neurological: Negative for dizziness, tremors, seizures, syncope, facial asymmetry, speech difficulty, weakness, light-headedness, numbness and headaches.  Hematological: Negative for adenopathy. Does not bruise/bleed easily.  Psychiatric/Behavioral: Negative for agitation, behavioral problems, confusion,  decreased concentration, dysphoric mood, hallucinations, self-injury, sleep disturbance and suicidal ideas. The patient is not nervous/anxious and is not hyperactive.    No Known  Allergies  No current medications   Patient Active Problem List   Diagnosis Date Noted  . Nocturia 01/22/2016  . Urinary urgency 01/22/2016  . BPH (benign prostatic hyperplasia) 10/05/2014  . ED (erectile dysfunction) 10/05/2014  . Cataract extraction status 04/08/2013  . Prostate CA ([place]) 04/08/2013  . Basal cell carcinoma 04/16/2012      Objective:   Physical Exam  Constitutional: He is oriented to person, place, and time. He appears well-developed and well-nourished.  HENT:  Head: Normocephalic and atraumatic.  Right Ear: External ear normal. No drainage, swelling or tenderness. Tympanic membrane is not injected, not scarred, not perforated, not erythematous, not retracted and not bulging. No middle ear effusion.  Left Ear: External ear normal. No drainage, swelling or tenderness. Tympanic membrane is not injected, not scarred, not perforated, not erythematous, not retracted and not bulging.  No middle ear effusion.  Nose: No mucosal edema, rhinorrhea, nasal deformity or septal deviation.  Mouth/Throat: Uvula is midline and oropharynx is clear and moist. Mucous membranes are not pale, not dry and not cyanotic. No oral lesions. Normal dentition. No dental abscesses, uvula swelling or lacerations. No oropharyngeal exudate, posterior oropharyngeal edema, posterior oropharyngeal erythema or tonsillar abscesses.  Eyes: Conjunctivae and EOM are normal. Pupils are equal, round, and reactive to light. Right eye exhibits no discharge. Left eye exhibits no discharge. No scleral icterus.  Neck: Normal range of motion. Neck supple. No JVD present. No tracheal deviation present. No thyromegaly present.  Cardiovascular: Normal rate, regular rhythm, normal heart sounds and intact distal pulses.  Exam reveals no gallop and no  friction rub.   No murmur heard. Pulmonary/Chest: Effort normal and breath sounds normal. No stridor. No respiratory distress. He has no wheezes. He has no rales.  Abdominal: Soft. Bowel sounds are normal. He exhibits no distension and no mass. There is no tenderness. There is no rebound and no guarding.  Musculoskeletal: Normal range of motion. He exhibits no edema, tenderness or deformity.  Lymphadenopathy:    He has no cervical adenopathy.  Neurological: He is alert and oriented to person, place, and time. No cranial nerve deficit. Coordination normal. GCS eye subscore is 4. GCS verbal subscore is 5. GCS motor subscore is 6.  Reflex Scores:      Brachioradialis reflexes are 2+ on the right side and 2+ on the left side.      Patellar reflexes are 1+ on the right side and 1+ on the left side.      Achilles reflexes are 2+ on the right side and 2+ on the left side. Patient with bilateral TKA  Skin: Skin is warm and dry. No rash noted. No erythema.  Psychiatric: He has a normal mood and affect. His behavior is normal.       Assessment & Plan:  1. Annual physical exam 2. Medicare annual wellness visit, subsequent Patient will be UTD on health maintenance following today's visit. Provided age-appropriate guidance.  3. Needs flu shot - Flu Vaccine QUAD 36+ mos IM  4. Need for hepatitis C screening test - Hepatitis C antibody  5. Need for pneumococcal vaccination - Pneumococcal polysaccharide vaccine 23-valent greater than or equal to 2yo subcutaneous/IM  6. Screening for diabetes mellitus No recent screening for DM, preventative screening measure. - Comprehensive metabolic panel  7. Screening for AAA (abdominal aortic aneurysm) Preventative screening measure. - US ABDOMINAL AORTA SCREENING AAA; Future  8. Prostate CA ([place]) 9. Benign prostatic hyperplasia with nocturia History of prostate cancer, currently followed by  [facility] urology.  - CBC with Differential/Platelet  10.  Hyperlipidemia, unspecified hyperlipidemia type History of mildly elevated cholesterol (205 mg/dL) in January 2016, yearly re-evaluation warranted. - Comprehensive metabolic panel - Lipid panel  11. Cold intolerance Chills and cold intolerance for past 6 months, warrants check of TSH. Will update patient with results and decide on further evaluation or management from there. - TSH

## 2016-02-28 LAB — HEPATITIS C ANTIBODY: HCV Ab: NEGATIVE

## 2016-03-05 ENCOUNTER — Encounter: Payer: Self-pay | Admitting: Physician Assistant

## 2016-03-06 ENCOUNTER — Ambulatory Visit
Admission: RE | Admit: 2016-03-06 | Discharge: 2016-03-06 | Disposition: A | Discharge status: Home / Self Care | Payer: Medicare Other | Source: Ambulatory Visit | Attending: Physician Assistant | Admitting: Physician Assistant

## 2016-03-06 ENCOUNTER — Encounter: Payer: Self-pay | Admitting: Physician Assistant

## 2016-03-06 DIAGNOSIS — Z136 Encounter for screening for cardiovascular disorders: Secondary | ICD-10-CM

## 2016-03-06 DIAGNOSIS — I77811 Abdominal aortic ectasia: Secondary | ICD-10-CM | POA: Insufficient documentation

## 2016-07-23 ENCOUNTER — Ambulatory Visit (INDEPENDENT_AMBULATORY_CARE_PROVIDER_SITE_OTHER): Payer: Medicare Other | Admitting: Family Medicine

## 2016-07-23 ENCOUNTER — Ambulatory Visit (INDEPENDENT_AMBULATORY_CARE_PROVIDER_SITE_OTHER): Payer: Medicare Other

## 2016-07-23 VITALS — BP 128/75 | HR 70 | Temp 97.8°F | Resp 18 | Ht 76.0 in | Wt 225.0 lb

## 2016-07-23 DIAGNOSIS — M25471 Effusion, right ankle: Secondary | ICD-10-CM

## 2016-07-23 DIAGNOSIS — M25571 Pain in right ankle and joints of right foot: Secondary | ICD-10-CM | POA: Diagnosis not present

## 2016-07-23 MED ORDER — PREDNISONE 20 MG PO TABS: ORAL_TABLET | ORAL | 0 refills | Status: DC

## 2016-07-23 MED ORDER — DICLOFENAC SODIUM 75 MG PO TBEC: 75.0000 mg | DELAYED_RELEASE_TABLET | Freq: Two times a day (BID) | ORAL | 0 refills | Status: DC

## 2016-07-23 NOTE — Patient Instructions (Addendum)
Take prednisone for inflammation and ankle 3 pills daily for 2 days, then 2 daily for 2 days, then 1 daily for 2 days. Take the pills together, preferably after breakfast.  Take diclofenac one twice daily at breakfast and supper. I have given you enough of this to last through the trip in case she needed it, but if you're doing substantially better you can decrease to 1 daily and then stop and see how you do.  We'll let you know the results of your labs  Return if worse    IF you received an x-ray today, you will receive an invoice from [facility]. Please contact [facility] at [phone] with questions or concerns regarding your invoice.   IF you received labwork today, you will receive an invoice from [facility]. Please contact [facility] at [phone] with questions or concerns regarding your invoice.   Our billing staff will not be able to assist you with questions regarding bills from these companies.  You will be contacted with the lab results as soon as they are available. The fastest way to get your results is to activate your My Chart account. Instructions are located on the last page of this paperwork. If you have not heard from [place] regarding the results in 2 weeks, please contact this office.

## 2016-07-23 NOTE — Progress Notes (Addendum)
Patient ID: Gordon Ellis, male    DOB: December 17, 1945  Age: 71 y.o. MRN: [id number]  Chief Complaint  Patient presents with  . Ankle Pain    Swollen R ankle.     Subjective:  71 year old man who has been pretty healthy. He does have a history of inactive prostate cancer that is followed regularly and no evidence of active disease. He is not on any regular medicines. For the past month or more he's been having swelling in the right ankle and pain in the joint. It is gradually bothered him more. He has had problems with pain during the night, but last night took an old hydrocodone pill he had and was able to get some sleep. Knows of no specific injury. He wonders about gout. He stays active doing work in the house and yard and other things. He walks a lot. No prior history of gout.   Current allergies, medications, problem list, past/family and social histories reviewed.  Objective:  BP 128/75   Pulse 70   Temp 97.8 F (36.6 C) (Oral)   Resp 18   Ht 6\' 4"  (1.93 m)   Wt 225 lb (102.1 kg)   SpO2 96%   BMI 27.39 kg/m   No major acute distress. 2+ pitting edema of the right t ankle, especially prone the medial malleolus. He is tender around the joint line. Dorsiflexion of the foot causes ankle pain. Negative Homans. Pedal pulses are good. Color is good. Left foot seems normal.   Assessment & Plan:   Assessment: 1. Pain in joint involving right ankle and foot   2. Swelling of ankle joint, right       Plan: Check x-ray of ankle and labs and proceed from there.  IMPRESSION: Soft tissue swelling without underlying acute bony or joint abnormality. No evidence of arthropathy.  Small plantar calcaneal spur.  Will treat as an inflammatory arthritic process in the ankle, possibly gout. Labs are pending.    Meds ordered this encounter  Medications  . predniSONE (DELTASONE) 20 MG tablet    Sig: Take 3 daily for 2 days, then 2 daily for 2 days, then 1 daily for 1 days    Dispense:   12 tablet    Refill:  0  . diclofenac (VOLTAREN) 75 MG EC tablet    Sig: Take 1 tablet (75 mg total) by mouth 2 (two) times daily.    Dispense:  40 tablet    Refill:  0         Patient Instructions   Take prednisone for inflammation and ankle 3 pills daily for 2 days, then 2 daily for 2 days, then 1 daily for 2 days. Take the pills together, preferably after breakfast.  Take diclofenac one twice daily at breakfast and supper. I have given you enough of this to last through the trip in case she needed it, but if you're doing substantially better you can decrease to 1 daily and then stop and see how you do.  We'll let you know the results of your labs  Return if worse    IF you received an x-ray today, you will receive an invoice from [facility]. Please contact [facility] at [phone] with questions or concerns regarding your invoice.   IF you received labwork today, you will receive an invoice from [facility]. Please contact [facility] at [phone] with questions or concerns regarding your invoice.   Our billing staff will not be able to assist you with questions regarding  bills from these companies.  You will be contacted with the lab results as soon as they are available. The fastest way to get your results is to activate your My Chart account. Instructions are located on the last page of this paperwork. If you have not heard from [place] regarding the results in 2 weeks, please contact this office.         Return if symptoms worsen or fail to improve.  D-dimer came back elevated. We'll schedule for a Doppler ultrasound.  July 24 2016 2:33 PM: Spoke to patient. Told him to go ahead and take the diclofenac and hope that it helps his ankle some. He has no evidence of clot and I feel it is fine for him to go ahead and take his plane trip next week. Suggested he might take a aspirin the day before, the day of, and the day after flying although there was not  great evidence that that would prevent clots, this is a chance it would help some. If he keeps having problems is to return and have the ankle and foot reassessed.  Marvel Mcphillips, MD 07/23/2016

## 2016-07-24 ENCOUNTER — Telehealth: Payer: Self-pay | Admitting: Physician Assistant

## 2016-07-24 ENCOUNTER — Ambulatory Visit (HOSPITAL_COMMUNITY)
Admission: RE | Admit: 2016-07-24 | Discharge: 2016-07-24 | Disposition: A | Discharge status: Home / Self Care | Payer: Medicare Other | Source: Ambulatory Visit | Attending: Family Medicine | Admitting: Family Medicine

## 2016-07-24 ENCOUNTER — Telehealth: Payer: Self-pay | Admitting: Emergency Medicine

## 2016-07-24 DIAGNOSIS — M25571 Pain in right ankle and joints of right foot: Secondary | ICD-10-CM | POA: Insufficient documentation

## 2016-07-24 DIAGNOSIS — M25471 Effusion, right ankle: Secondary | ICD-10-CM

## 2016-07-24 LAB — COMPREHENSIVE METABOLIC PANEL
A/G RATIO: 2.2 (ref 1.2–2.2)
ALBUMIN: 4.4 g/dL (ref 3.5–4.8)
ALT: 22 IU/L (ref 0–44)
AST: 24 IU/L (ref 0–40)
Alkaline Phosphatase: 62 IU/L (ref 39–117)
BUN/Creatinine Ratio: 17 (ref 10–24)
BUN: 12 mg/dL (ref 8–27)
Bilirubin Total: 0.5 mg/dL (ref 0.0–1.2)
CALCIUM: 9.3 mg/dL (ref 8.6–10.2)
CO2: 25 mmol/L (ref 18–29)
CREATININE: 0.7 mg/dL — AB (ref 0.76–1.27)
Chloride: 98 mmol/L (ref 96–106)
GFR, EST AFRICAN AMERICAN: 110 mL/min/{1.73_m2} (ref >59)
GFR, EST NON AFRICAN AMERICAN: 95 mL/min/{1.73_m2} (ref >59)
GLOBULIN, TOTAL: 2 g/dL (ref 1.5–4.5)
Glucose: 93 mg/dL (ref 65–99)
POTASSIUM: 4.8 mmol/L (ref 3.5–5.2)
SODIUM: 139 mmol/L (ref 134–144)
TOTAL PROTEIN: 6.4 g/dL (ref 6.0–8.5)

## 2016-07-24 LAB — URIC ACID: URIC ACID: 4.7 mg/dL (ref 3.7–8.6)

## 2016-07-24 LAB — D-DIMER, QUANTITATIVE: D-DIMER: 1.46 mg/L FEU — ABNORMAL HIGH (ref 0.00–0.49)

## 2016-07-24 NOTE — Progress Notes (Signed)
VASCULAR LAB PRELIMINARY  PRELIMINARY  PRELIMINARY  PRELIMINARY  Right lower extremity venous duplex completed.    Preliminary report:  Right:  No evidence of DVT, superficial thrombosis, or Baker's cyst.  Maurya Nethery, RVS 07/24/2016, 1:26 PM

## 2016-07-24 NOTE — Telephone Encounter (Signed)
Left message to return call for scheduled [place] appointment today @ 1pm [facility] Vascular Lab

## 2016-07-24 NOTE — Telephone Encounter (Signed)
Vascular lab called negative R DVT results in Information given to provider.

## 2016-07-24 NOTE — Addendum Note (Signed)
Addended by: Candie Chroman D on: 07/24/2016 08:23 AM   Modules accepted: Orders

## 2016-07-24 NOTE — Telephone Encounter (Signed)
Pt returned message Advised appointment is scheduled at [facility] entrance @ 1pm.  Verbalized understanding

## 2016-07-24 NOTE — Telephone Encounter (Signed)
Pt has more questions about his elevated blood legs -he was seen yesterday by hopper  Best number  [phone]

## 2016-07-24 NOTE — Telephone Encounter (Signed)
Dr. Linna Darner spoke with patient

## 2017-01-06 ENCOUNTER — Encounter: Payer: Self-pay | Admitting: Family Medicine

## 2017-01-06 ENCOUNTER — Ambulatory Visit (INDEPENDENT_AMBULATORY_CARE_PROVIDER_SITE_OTHER): Payer: Medicare Other | Admitting: Family Medicine

## 2017-01-06 VITALS — BP 104/64 | HR 73 | Temp 98.3°F | Resp 16 | Ht 76.0 in | Wt 229.0 lb

## 2017-01-06 DIAGNOSIS — T24202A Burn of second degree of unspecified site of left lower limb, except ankle and foot, initial encounter: Secondary | ICD-10-CM | POA: Diagnosis not present

## 2017-01-06 MED ORDER — SILVER SULFADIAZINE 1 % EX CREA: 1.0000 "application " | TOPICAL_CREAM | Freq: Every day | CUTANEOUS | 0 refills | Status: DC

## 2017-01-06 NOTE — Patient Instructions (Addendum)
Second-Degree Burn, Adult A second-degree burn, also called a partial thickness wound, is a serious injury that affects the first two layers of skin. A second-degree burn may be minor or major, depending on the size of the burn and which parts of the skin are burned. What are the causes? This condition may be caused by:  Heat. Burns caused by heat happen when skin comes in contact with something very hot, such as a flame or hot liquid.  Radiation. Sources of radiation include sunlight, radiation used to heat food, and radiation treatments.  Electricity. Burns caused by electricity happen when electricity passes through the body from lightning, wiring, electrical outlets, appliances, or power lines.  Certain chemicals, such as acids that come in contact with the skin or eyes. Some chemicals can go through clothing.  What increases the risk? This condition is more likely to occur in people who:  Are often exposed to high-risk environments, such as those with open flames, chemicals, or electricity.  Have cancer and are being treated with radiation.  What are the signs or symptoms? Symptoms of this condition include:  Severe pain.  Skin that is deep red, blistered, tender, and swollen.  Skin that has changed color.  Skin that looks blotchy, wet, or shiny.  How is this diagnosed? This condition is usually diagnosed with an exam of the wounded area. To get a better look at the wound, your health care provider may remove any blistered skin. It may take several days to find out if you have a second-degree burn because the signs of this kind of burn can take time to develop. You may need to watch the wound for changes at home and visit a health care provider repeatedly to have your wound checked for changes. If the wound is large, you may need to stay in the hospital so a health care team can examine the wound for a few days. How is this treated? Treatment depends on the severity and cause  of the burn. Healing may take several weeks. Some second-degree burns, including major burns, electrical burns, and chemical burns, may need to be treated in a hospital. Treatment may involve:  Cooling the burn with cool, germ-free (sterile) water.  Taking or applying medicines, such as: ? Medicines to relieve pain or itching. ? Ointments to treat or prevent infection. ? Antibiotic medicine to treat or prevent infection.  Getting a tetanus shot.  Covering the burn with a bandage (dressing). If your fingers or toes were burned, each of them may be bandaged separately.  Compression dressings to prevent scarring and help the burned body part stay moveable.  Removing dead skin. This is done by a health care provider. Do not try to remove dead skin yourself.  Deep burns can cause skin tissue to die, and a scab (eschar) may form where the skin used to be. If you have a deep burn and an eschar forms, you may need surgery to remove the eschar so the skin can heal properly. If your wound is deep and large (it covers more than 15% of your skin), treatment may also involve:  Receiving fluids and nutrition.  Close monitoring of blood flow near the wound.  Oxygen given through a mask or a machine (ventilator). This may be needed if a burn causes fluid shifts in the body that make it hard to breathe.  Follow these instructions at home: Wound care  Follow instructions from your health care provider about how to take care of your   wound. Make sure you: ? Wash your hands with soap and water before you change your dressing. If soap and water are not available, use hand sanitizer. ? Change your dressing as directed. ? If you have a compression dressing, wear it as directed.  Clean your wound 2-3 times a day or as often as directed. ? Wash the wound with mild soap and water. ? Rinse the wound with water to remove all soap. ? Pat the wound dry with a clean towel. Do not rub it.  Check your wound  every day for signs of infection. Check for: ? More redness, swelling, or pain. ? More fluid or blood. ? Warmth. ? Pus or a bad smell. ? Yellow or green fluid.  Do not scratch or pick at the wound  Do not break any blisters or peel any skin.  Avoid exposing your wound to the sun. Medicine  Take and apply over-the-counter and prescription medicines only as told by your health care provider.  Take or apply your antibiotic medicine as told by your health care provider. Do not stop using the antibiotic even if you start to feel better. Eating and drinking  Drink enough fluid to keep your urine clear or pale yellow.  Eat a nutritious diet that is high in protein. This will help your wound to heal. General instructions  Raise (elevate) the injured area above the level of your heart while sitting or lying down.  Rest as directed. Do not exercise until your health care provider approves.  Do not take baths, swim, or use a hot tub until your health care provider approves.  Do not put ice on your burn. This can cause more damage. Try cooling the burn with: ? Cool water. ? A cool, wet, clean cloth (cool compress).  Keep all follow-up visits as directed. This is important. Contact a health care provider if:  Your symptoms do not improve with treatment.  Your pain is not relieved with medicine.  You have more redness, swelling, or pain around your wound.  You have more fluid or blood coming from your wound.  You have yellow or green fluid, pus, or a bad smell coming from your wound.  Your wound feels warm to the touch.  You have a fever. Get help right away if:  You develop red streaks near the wound.  You develop severe pain. Summary  A second-degree burn is a serious injury that affects the first two layers of skin.  Clean your wound 2-3 times a day or as often as directed. Check your wound every day for signs of infection.  Do not scratch or pick at your wound,  break blisters, peel skin, or put ice on your burn. This information is not intended to replace advice given to you by your health care provider. Make sure you discuss any questions you have with your health care provider. Document Released: 09/10/2010 Document Revised: 03/19/2016 Document Reviewed: 03/19/2016 Elsevier Interactive Patient Education  2018 [employer] Inc[employer]   
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
## 2017-01-09 ENCOUNTER — Ambulatory Visit (INDEPENDENT_AMBULATORY_CARE_PROVIDER_SITE_OTHER): Payer: Medicare Other | Admitting: Family Medicine

## 2017-01-09 ENCOUNTER — Encounter: Payer: Self-pay | Admitting: Family Medicine

## 2017-01-09 VITALS — BP 104/62 | HR 82 | Temp 98.0°F | Resp 18 | Ht 76.0 in | Wt 226.2 lb

## 2017-01-09 DIAGNOSIS — T24202D Burn of second degree of unspecified site of left lower limb, except ankle and foot, subsequent encounter: Secondary | ICD-10-CM

## 2017-01-09 DIAGNOSIS — Z23 Encounter for immunization: Secondary | ICD-10-CM

## 2017-01-09 NOTE — Patient Instructions (Signed)
     IF you received an x-ray today, you will receive an invoice from [facility]. Please contact [facility] at [phone] with questions or concerns regarding your invoice.   IF you received labwork today, you will receive an invoice from [facility]. Please contact [facility] at [phone] with questions or concerns regarding your invoice.   Our billing staff will not be able to assist you with questions regarding bills from these companies.  You will be contacted with the lab results as soon as they are available. The fastest way to get your results is to activate your My Chart account. Instructions are located on the last page of this paperwork. If you have not heard from us regarding the results in 2 weeks, please contact this office.     
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
## 2017-01-16 ENCOUNTER — Telehealth: Payer: Self-pay

## 2017-01-16 NOTE — Telephone Encounter (Signed)
Called pt to schedule Medicare Annual Wellness Visit with nurse health advisor -nr

## 2017-02-10 ENCOUNTER — Telehealth: Payer: Self-pay

## 2017-02-10 NOTE — Telephone Encounter (Signed)
LMVM for patient to CB to schedule a Medicare CPE. Can speak with anyone to do this.  Also, patient's COPD needs to be addressed at that visit, per quality metrics.

## 2017-04-01 ENCOUNTER — Ambulatory Visit: Payer: Medicare Other

## 2017-04-03 ENCOUNTER — Ambulatory Visit (INDEPENDENT_AMBULATORY_CARE_PROVIDER_SITE_OTHER): Payer: Medicare Other

## 2017-04-03 VITALS — BP 116/68 | HR 70 | Temp 97.6°F | Ht 76.0 in | Wt 213.4 lb

## 2017-04-03 DIAGNOSIS — Z0000 Encounter for general adult medical examination without abnormal findings: Secondary | ICD-10-CM

## 2017-04-03 DIAGNOSIS — Z131 Encounter for screening for diabetes mellitus: Secondary | ICD-10-CM | POA: Diagnosis not present

## 2017-04-03 DIAGNOSIS — Z13220 Encounter for screening for lipoid disorders: Secondary | ICD-10-CM

## 2017-04-03 NOTE — Patient Instructions (Addendum)
[name] , Thank you for taking time to come for your Medicare Wellness Visit. I appreciate your ongoing commitment to your health goals. Please review the following plan we discussed and let me know if I can assist you in the future.   Screening recommendations/referrals: Colonoscopy: up to date, next due 04/01/2018 Recommended yearly ophthalmology/optometry visit for glaucoma screening and checkup Recommended yearly dental visit for hygiene and checkup  Vaccinations: Influenza vaccine: up to date Pneumococcal vaccine: up to date Tdap vaccine: up to date, next due 12/23/2022 Shingles vaccine: up to date, Check with your pharmacy about receiving the Shingrix vaccine     Advanced directives: Please bring a copy of your POA (Power of [place]) and/or Living Will to your next appointment.   Conditions/risks identified: Talk with your physician about  improving your body temperature. Blood work has been ordered to cover this today.   Next appointment: schedule follow up visit with your PCP, 1 year for AWV  Preventive Care 19 Years and Older, Male Preventive care refers to lifestyle choices and visits with your health care provider that can promote health and wellness. What does preventive care include?  A yearly physical exam. This is also called an annual well check.  Dental exams once or twice a year.  Routine eye exams. Ask your health care provider how often you should have your eyes checked.  Personal lifestyle choices, including:  Daily care of your teeth and gums.  Regular physical activity.  Eating a healthy diet.  Avoiding tobacco and drug use.  Limiting alcohol use.  Practicing safe sex.  Taking low doses of aspirin every day.  Taking vitamin and mineral supplements as recommended by your health care provider. What happens during an annual well check? The services and screenings done by your health care provider during your annual well check will depend on your  age, overall health, lifestyle risk factors, and family history of disease. Counseling  Your health care provider may ask you questions about your:  Alcohol use.  Tobacco use.  Drug use.  Emotional well-being.  Home and relationship well-being.  Sexual activity.  Eating habits.  History of falls.  Memory and ability to understand (cognition).  Work and work Statistician. Screening  You may have the following tests or measurements:  Height, weight, and BMI.  Blood pressure.  Lipid and cholesterol levels. These may be checked every 5 years, or more frequently if you are over 86 years old.  Skin check.  Lung cancer screening. You may have this screening every year starting at age 26 if you have a 30-pack-year history of smoking and currently smoke or have quit within the past 15 years.  Fecal occult blood test (FOBT) of the stool. You may have this test every year starting at age 61.  Flexible sigmoidoscopy or colonoscopy. You may have a sigmoidoscopy every 5 years or a colonoscopy every 10 years starting at age 48.  Prostate cancer screening. Recommendations will vary depending on your family history and other risks.  Hepatitis C blood test.  Hepatitis B blood test.  Sexually transmitted disease (STD) testing.  Diabetes screening. This is done by checking your blood sugar (glucose) after you have not eaten for a while (fasting). You may have this done every 1-3 years.  Abdominal aortic aneurysm (AAA) screening. You may need this if you are a current or former smoker.  Osteoporosis. You may be screened starting at age 4 if you are at high risk. Talk with your health  care provider about your test results, treatment options, and if necessary, the need for more tests. Vaccines  Your health care provider may recommend certain vaccines, such as:  Influenza vaccine. This is recommended every year.  Tetanus, diphtheria, and acellular pertussis (Tdap, Td) vaccine. You  may need a Td booster every 10 years.  Zoster vaccine. You may need this after age 72.  Pneumococcal 13-valent conjugate (PCV13) vaccine. One dose is recommended after age 86.  Pneumococcal polysaccharide (PPSV23) vaccine. One dose is recommended after age 55. Talk to your health care provider about which screenings and vaccines you need and how often you need them. This information is not intended to replace advice given to you by your health care provider. Make sure you discuss any questions you have with your health care provider. Document Released: 05/05/2015 Document Revised: 12/27/2015 Document Reviewed: 02/07/2015 Elsevier Interactive Patient Education  2017 [place] Prevention in the Home Falls can cause injuries. They can happen to people of all ages. There are many things you can do to make your home safe and to help prevent falls. What can I do on the outside of my home?  Regularly fix the edges of walkways and driveways and fix any cracks.  Remove anything that might make you trip as you walk through a door, such as a raised step or threshold.  Trim any bushes or trees on the path to your home.  Use bright outdoor lighting.  Clear any walking paths of anything that might make someone trip, such as rocks or tools.  Regularly check to see if handrails are loose or broken. Make sure that both sides of any steps have handrails.  Any raised decks and porches should have guardrails on the edges.  Have any leaves, snow, or ice cleared regularly.  Use sand or salt on walking paths during winter.  Clean up any spills in your garage right away. This includes oil or grease spills. What can I do in the bathroom?  Use night lights.  Install grab bars by the toilet and in the tub and shower. Do not use towel bars as grab bars.  Use non-skid mats or decals in the tub or shower.  If you need to sit down in the shower, use a plastic, non-slip stool.  Keep the floor  dry. Clean up any water that spills on the floor as soon as it happens.  Remove soap buildup in the tub or shower regularly.  Attach bath mats securely with double-sided non-slip rug tape.  Do not have throw rugs and other things on the floor that can make you trip. What can I do in the bedroom?  Use night lights.  Make sure that you have a light by your bed that is easy to reach.  Do not use any sheets or blankets that are too big for your bed. They should not hang down onto the floor.  Have a firm chair that has side arms. You can use this for support while you get dressed.  Do not have throw rugs and other things on the floor that can make you trip. What can I do in the kitchen?  Clean up any spills right away.  Avoid walking on wet floors.  Keep items that you use a lot in easy-to-reach places.  If you need to reach something above you, use a strong step stool that has a grab bar.  Keep electrical cords out of the way.  Do not use floor  polish or wax that makes floors slippery. If you must use wax, use non-skid floor wax.  Do not have throw rugs and other things on the floor that can make you trip. What can I do with my stairs?  Do not leave any items on the stairs.  Make sure that there are handrails on both sides of the stairs and use them. Fix handrails that are broken or loose. Make sure that handrails are as long as the stairways.  Check any carpeting to make sure that it is firmly attached to the stairs. Fix any carpet that is loose or worn.  Avoid having throw rugs at the top or bottom of the stairs. If you do have throw rugs, attach them to the floor with carpet tape.  Make sure that you have a light switch at the top of the stairs and the bottom of the stairs. If you do not have them, ask someone to add them for you. What else can I do to help prevent falls?  Wear shoes that:  Do not have high heels.  Have rubber bottoms.  Are comfortable and fit you  well.  Are closed at the toe. Do not wear sandals.  If you use a stepladder:  Make sure that it is fully opened. Do not climb a closed stepladder.  Make sure that both sides of the stepladder are locked into place.  Ask someone to hold it for you, if possible.  Clearly mark and make sure that you can see:  Any grab bars or handrails.  First and last steps.  Where the edge of each step is.  Use tools that help you move around (mobility aids) if they are needed. These include:  Canes.  Walkers.  Scooters.  Crutches.  Turn on the lights when you go into a dark area. Replace any light bulbs as soon as they burn out.  Set up your furniture so you have a clear path. Avoid moving your furniture around.  If any of your floors are uneven, fix them.  If there are any pets around you, be aware of where they are.  Review your medicines with your doctor. Some medicines can make you feel dizzy. This can increase your chance of falling. Ask your doctor what other things that you can do to help prevent falls. This information is not intended to replace advice given to you by your health care provider. Make sure you discuss any questions you have with your health care provider. Document Released: 02/02/2009 Document Revised: 09/14/2015 Document Reviewed: 05/13/2014 Elsevier Interactive Patient Education  2017 [employer].

## 2017-04-03 NOTE — Progress Notes (Signed)
Subjective:   Gordon Ellis is a 71 y.o. male who presents for Medicare Annual/Subsequent preventive examination.  Review of Systems:  N/A Cardiac Risk Factors include: advanced age (>60men, >4 women);male gender     Objective:    Vitals: BP 116/68   Pulse 70   Temp 97.6 F (36.4 C) (Oral)   Ht 6\' 4"  (1.93 m)   Wt 213 lb 6 oz (96.8 kg)   SpO2 97%   BMI 25.97 kg/m   Body mass index is 25.97 kg/m.  Advanced Directives 04/03/2017  Does Patient Have a Medical Advance Directive? Yes  Type of Paramedic of [place];Living will  Copy of [place] in Chart? No - copy requested    Tobacco Social History   Tobacco Use  Smoking Status Never Smoker  Smokeless Tobacco Never Used     Counseling given: Not Answered   Clinical Intake:  Pre-visit preparation completed: Yes  Pain : No/denies pain     Nutritional Status: BMI 25 -29 Overweight Nutritional Risks: None Diabetes: No  Activities of Daily Living: Independent Ambulation: Independent with device- listed below Home Assistive Devices/Equipment: Eyeglasses Medication Administration: Independent Home Management: Independent  Barriers to Care Management & Learning: None  Do you feel unsafe in your current relationship?: No Do you feel physically threatened by others?: No Anyone hurting you at home, work, or school?: No Unable to ask?: No  How often do you need to have someone help you when you read instructions, pamphlets, or other written materials from your doctor or pharmacy?: 1 - Never What is the last grade level you completed in school?: Bachelors degree  Interpreter Needed?: No  Information entered by :: Andrez Grime, LPN  Past Medical History:  Diagnosis Date  . Cancer [facility])    prostate  . Cataract   . Sebaceous cyst    left arm   Past Surgical History:  Procedure Laterality Date  . [place]   left  . EYE SURGERY    . [place]  2010   right  . MOUTH SURGERY    . QUADRICEPS REPAIR Bilateral   . thumb surgery  2011   left  . VASECTOMY     Family History  Problem Relation Age of Onset  . Cancer Mother        leukemia  . Heart disease Father   . Cancer Brother        prostate  . Hyperlipidemia Brother   . Healthy Sister   . Cancer Paternal Grandfather        colon   Social History   Socioeconomic History  . Marital status: Married    Spouse name: None  . Number of children: 2  . Years of education: None  . Highest education level: Bachelor's degree (e.g., BA, AB, BS)  Social Needs  . Financial resource strain: Not hard at all  . Food insecurity - worry: Never true  . Food insecurity - inability: Never true  . Transportation needs - medical: No  . Transportation needs - non-medical: No  Occupational History  . None  Tobacco Use  . Smoking status: Never Smoker  . Smokeless tobacco: Never Used  Substance and Sexual Activity  . Alcohol use: Yes    Alcohol/week: 0.6 oz    Types: 1 Standard drinks or equivalent per week  . Drug use: No  . Sexual activity: Yes  Other  Topics Concern  . None  Social History Narrative   Retired, previously works for [employer] all over the country and [employer]     Outpatient Encounter Medications as of 04/03/2017  Medication Sig  . diclofenac (VOLTAREN) 75 MG EC tablet Take 1 tablet (75 mg total) by mouth 2 (two) times daily. (Patient taking differently: Take 75 mg by mouth 2 (two) times daily as needed. )  . [DISCONTINUED] predniSONE (DELTASONE) 20 MG tablet Take 3 daily for 2 days, then 2 daily for 2 days, then 1 daily for 1 days (Patient not taking: Reported on 01/06/2017)  . [DISCONTINUED] silver sulfADIAZINE (SILVADENE) 1 % cream Apply 1 application topically daily.   No facility-administered encounter medications on file as of 04/03/2017.     Activities of Daily Living In your present state of health, do  you have any difficulty performing the following activities: 04/03/2017  Hearing? Y  Comment Patient has some issues with hearing the TV  Vision? N  Difficulty concentrating or making decisions? N  Walking or climbing stairs? N  Dressing or bathing? N  Doing errands, shopping? N  Preparing Food and eating ? N  Using the Toilet? N  In the past six months, have you accidently leaked urine? Y  Comment Patient has an enlarged prostate  Do you have problems with loss of bowel control? N  Managing your Medications? N  Managing your Finances? N  Housekeeping or managing your Housekeeping? N  Some recent data might be hidden    Timed Get Up and Go Performed: yes, passed, completed within 30 Seconds  Patient Care Team: Harrison Mons, PA-C as PCP - General (Family Medicine) Myrlene Broker, MD as Attending Physician (Urology) Warden Fillers, MD as Consulting Physician (Ophthalmology) Danella Sensing, MD as Consulting Physician (Dermatology)   Assessment:    Exercise Activities and Dietary recommendations Current Exercise Habits: Structured exercise class, Type of exercise: walking(senior exercise class, biking), Time (Minutes): 45, Frequency (Times/Week): 7, Weekly Exercise (Minutes/Week): 315, Intensity: Moderate  Goals    . body tempertaure     Patient wants to improve his body temperature. He stays cold a lot and would like to improve this in the near future.       Fall Risk Fall Risk  04/03/2017 01/06/2017 07/23/2016 02/27/2016 05/05/2014  Falls in the past year? No Yes No No No  Number falls in past yr: - 1 - - -  Injury with Fall? - Yes - - -  Comment - right knee scratches/bruises - - -   Is the patient's home free of loose throw rugs in walkways, pet beds, electrical cords, etc?   yes      Grab bars in the bathroom? no      Handrails on the stairs?   yes      Adequate lighting?   yes Depression Screen PHQ 2/9 Scores 04/03/2017 01/06/2017 07/23/2016 02/27/2016  PHQ - 2  Score 0 0 0 0    Cognitive Function     6CIT Screen 04/03/2017  What Year? 0 points  What month? 0 points  What time? 0 points  Count back from 20 0 points  Months in reverse 0 points  Repeat phrase 0 points  Total Score 0    Immunization History  Administered Date(s) Administered  . Influenza Whole 01/20/2013  . Influenza,inj,Quad PF,6+ Mos 03/21/2014, 02/27/2016, 01/09/2017  . Pneumococcal Conjugate-13 05/05/2014  . Pneumococcal Polysaccharide-23 02/27/2016  . Tdap 12/22/2012  . Zoster 04/22/2013  Screening Tests Health Maintenance  Topic Date Due  . COLONOSCOPY  04/01/2018  . TETANUS/TDAP  12/23/2022  . INFLUENZA VACCINE  Completed  . Hepatitis C Screening  Completed  . PNA vac Low Risk Adult  Completed   Cancer Screenings: Lung:  Low Dose CT Chest recommended if Age 4-80 years, 30 pack-year currently smoking OR have quit w/in 15years. Patient does not qualify. Breast:  Up to date on Mammogram? N/A   Up to date of Bone Density/Dexa? N/A Colorectal: yes, completed 04/01/2008  Additional Screenings:  Hepatitis B/HIV/Syphillis: not indicated  Hepatitis C Screening: completed 02/27/2016    Plan:   I have personally reviewed and noted the following in the patient's chart:   . Medical and social history . Use of alcohol, tobacco or illicit drugs  . Current medications and supplements . Functional ability and status . Nutritional status . Physical activity . Advanced directives . List of other physicians . Hospitalizations, surgeries, and ER visits in previous 12 months . Vitals . Screenings to include cognitive, depression, and falls . Referrals and appointments  In addition, I have reviewed and discussed with patient certain preventive protocols, quality metrics, and best practice recommendations. A written personalized care plan for preventive services as well as general preventive health recommendations were provided to patient.   1. Screening cholesterol  level - Lipid panel - Comprehensive metabolic panel  2. Screening for diabetes mellitus - CBC with Differential/Platelet - Urinalysis, Complete  3. Encounter for Medicare annual wellness exam     Andrez Grime, LPN  07/04/9456

## 2017-04-04 LAB — MICROSCOPIC EXAMINATION
Bacteria, UA: NONE SEEN
CASTS: NONE SEEN /LPF
EPITHELIAL CELLS (NON RENAL): NONE SEEN /HPF (ref 0–10)
RBC, UA: NONE SEEN /hpf (ref 0–?)

## 2017-04-04 LAB — COMPREHENSIVE METABOLIC PANEL
A/G RATIO: 2.1 (ref 1.2–2.2)
ALT: 17 IU/L (ref 0–44)
AST: 21 IU/L (ref 0–40)
Albumin: 4.1 g/dL (ref 3.5–4.8)
Alkaline Phosphatase: 58 IU/L (ref 39–117)
BUN/Creatinine Ratio: 22 (ref 10–24)
BUN: 14 mg/dL (ref 8–27)
Bilirubin Total: 0.5 mg/dL (ref 0.0–1.2)
CALCIUM: 9.1 mg/dL (ref 8.6–10.2)
CO2: 26 mmol/L (ref 20–29)
CREATININE: 0.63 mg/dL — AB (ref 0.76–1.27)
Chloride: 100 mmol/L (ref 96–106)
GFR calc Af Amer: 115 mL/min/{1.73_m2} (ref >59)
GFR, EST NON AFRICAN AMERICAN: 99 mL/min/{1.73_m2} (ref >59)
GLOBULIN, TOTAL: 2 g/dL (ref 1.5–4.5)
Glucose: 87 mg/dL (ref 65–99)
Potassium: 4.6 mmol/L (ref 3.5–5.2)
Sodium: 135 mmol/L (ref 134–144)
TOTAL PROTEIN: 6.1 g/dL (ref 6.0–8.5)

## 2017-04-04 LAB — LIPID PANEL
CHOLESTEROL TOTAL: 188 mg/dL (ref 100–199)
Chol/HDL Ratio: 3 ratio (ref 0.0–5.0)
HDL: 63 mg/dL (ref >39)
LDL CALC: 112 mg/dL — AB (ref 0–99)
TRIGLYCERIDES: 64 mg/dL (ref 0–149)
VLDL Cholesterol Cal: 13 mg/dL (ref 5–40)

## 2017-04-04 LAB — URINALYSIS, COMPLETE
BILIRUBIN UA: NEGATIVE
Glucose, UA: NEGATIVE
KETONES UA: NEGATIVE
LEUKOCYTES UA: NEGATIVE
NITRITE UA: NEGATIVE
PH UA: 7 (ref 5.0–7.5)
Protein, UA: NEGATIVE
RBC UA: NEGATIVE
Specific Gravity, UA: 1.006 (ref 1.005–1.030)
UUROB: 0.2 mg/dL (ref 0.2–1.0)

## 2017-04-04 LAB — CBC WITH DIFFERENTIAL/PLATELET
BASOS: 1 %
Basophils Absolute: 0 10*3/uL (ref 0.0–0.2)
EOS (ABSOLUTE): 0.1 10*3/uL (ref 0.0–0.4)
EOS: 1 %
HEMATOCRIT: 40.7 % (ref 37.5–51.0)
HEMOGLOBIN: 13.7 g/dL (ref 13.0–17.7)
IMMATURE GRANS (ABS): 0 10*3/uL (ref 0.0–0.1)
IMMATURE GRANULOCYTES: 0 %
LYMPHS: 42 %
Lymphocytes Absolute: 2.4 10*3/uL (ref 0.7–3.1)
MCH: 31.5 pg (ref 26.6–33.0)
MCHC: 33.7 g/dL (ref 31.5–35.7)
MCV: 94 fL (ref 79–97)
Monocytes Absolute: 0.2 10*3/uL (ref 0.1–0.9)
Monocytes: 4 %
NEUTROS ABS: 3 10*3/uL (ref 1.4–7.0)
NEUTROS PCT: 52 %
Platelets: 219 10*3/uL (ref 150–379)
RBC: 4.35 x10E6/uL (ref 4.14–5.80)
RDW: 13.5 % (ref 12.3–15.4)
WBC: 5.8 10*3/uL (ref 3.4–10.8)

## 2017-04-10 ENCOUNTER — Telehealth: Payer: Self-pay | Admitting: Physician Assistant

## 2017-04-10 NOTE — Telephone Encounter (Signed)
Patient calling for lab results drawn on 04/03/2017. Please advise, thanks

## 2017-04-10 NOTE — Telephone Encounter (Signed)
Copied from [place]. Topic: Quick Communication - See Telephone Encounter >> Apr 10, 2017 10:32 AM Cleaster Corin, NT wrote: CRM for notification. See Telephone encounter for:   04/10/17. Pt. Calling to get lab results. Pt can be reached at [phone]

## 2017-04-11 NOTE — Telephone Encounter (Signed)
Result note sent to lab to contact patient.

## 2017-07-28 ENCOUNTER — Encounter: Payer: Self-pay | Admitting: Physician Assistant

## 2017-09-03 ENCOUNTER — Other Ambulatory Visit: Payer: Self-pay

## 2017-09-03 ENCOUNTER — Ambulatory Visit: Payer: Medicare Other | Admitting: Physician Assistant

## 2017-09-03 ENCOUNTER — Encounter: Payer: Self-pay | Admitting: Physician Assistant

## 2017-09-03 VITALS — BP 118/60 | HR 71 | Temp 97.6°F | Resp 16 | Ht 76.0 in | Wt 228.0 lb

## 2017-09-03 DIAGNOSIS — H6122 Impacted cerumen, left ear: Secondary | ICD-10-CM | POA: Diagnosis not present

## 2017-09-03 NOTE — Progress Notes (Signed)
    09/03/2017 10:16 AM   DOB: 1946-04-15 / MRN: [id number]  SUBJECTIVE:  Gordon Ellis is a 72 y.o. male presenting for left-sided acute hearing loss and pressure sensation in the ear.  Started about a week ago and seems to be getting worse.  Denies fever, dizziness, headache, weakness..  He has No Known Allergies.   He  has a past medical history of Cancer ([place]), Cataract, and Sebaceous cyst.    He  reports that he has never smoked. He has never used smokeless tobacco. He reports that he drinks about 0.6 oz of alcohol per week. He reports that he does not use drugs. He  reports that he currently engages in sexual activity. The patient  has a past surgical history that includes Knee surgery (2010); thumb surgery (2011); Mouth surgery; Bunionectomy (1995); Hernia repair (1994); Eye surgery; Vasectomy; and Quadriceps repair (Bilateral).  His family history includes Cancer in his brother, mother, and paternal grandfather; Healthy in his sister; Heart disease in his father; Hyperlipidemia in his brother.  ROS per HPI  The problem list and medications were reviewed and updated by myself where necessary and exist elsewhere in the encounter.   OBJECTIVE:  BP 118/60 (BP Location: Left Arm, Patient Position: Sitting, Cuff Size: Large)   Pulse 71   Temp 97.6 F (36.4 C) (Oral)   Resp 16   Ht 6\' 4"  (1.93 m)   Wt 228 lb (103.4 kg)   SpO2 96%   BMI 27.75 kg/m   Physical Exam  Constitutional: He is oriented to person, place, and time. He appears well-developed. He does not appear ill.  HENT:  Ears:  Eyes: Pupils are equal, round, and reactive to light. Conjunctivae and EOM are normal.  Cardiovascular: Normal rate.  Pulmonary/Chest: Effort normal.  Abdominal: He exhibits no distension.  Musculoskeletal: Normal range of motion.  Neurological: He is alert and oriented to person, place, and time. No cranial nerve deficit. Coordination normal.  Skin: Skin is warm and dry. He is not  diaphoretic.  Psychiatric: He has a normal mood and affect.  Nursing note and vitals reviewed.   No results found for this or any previous visit (from the past 72 hour(s)).  No results found.  ASSESSMENT AND PLAN:  [name] was seen today for left ear pain.  Diagnoses and all orders for this visit:  Hearing loss of left ear due to cerumen impaction: Resolved in the clinic.  RTC as needed.     The patient is advised to call or return to clinic if he does not see an improvement in symptoms, or to seek the care of the closest emergency department if he worsens with the above plan.   Philis Fendt, MHS, PA-C [facility] 09/03/2017 10:16 AM

## 2017-09-03 NOTE — Patient Instructions (Addendum)
     IF you received an x-ray today, you will receive an invoice from [facility]. Please contact  [facility] at [phone] with questions or concerns regarding your invoice.   IF you received labwork today, you will receive an invoice from [facility]. Please contact [facility] at [phone] with questions or concerns regarding your invoice.   Our billing staff will not be able to assist you with questions regarding bills from these companies.  You will be contacted with the lab results as soon as they are available. The fastest way to get your results is to activate your My Chart account. Instructions are located on the last page of this paperwork. If you have not heard from us regarding the results in 2 weeks, please contact this office.     

## 2017-10-01 ENCOUNTER — Encounter: Payer: Self-pay | Admitting: Family Medicine

## 2017-10-01 ENCOUNTER — Ambulatory Visit: Payer: Medicare Other | Admitting: Family Medicine

## 2017-10-01 DIAGNOSIS — M25562 Pain in left knee: Secondary | ICD-10-CM | POA: Diagnosis not present

## 2017-10-01 MED ORDER — NAPROXEN 500 MG PO TABS: 500.0000 mg | ORAL_TABLET | Freq: Two times a day (BID) | ORAL | 1 refills | Status: DC | PRN

## 2017-10-01 NOTE — Patient Instructions (Signed)
You have a large baker's cyst - usually these come about from the mild arthritis in your knee flaring up and causing an outpouching of the fluid back there. Take naproxen 500mg  twice a day with food for pain and inflammation. Icing 15 minutes at a time 3-4 times a day. Compression sleeve when up and walking around (bodyhelix). Elevate above your heart as needed. If you're not improving after the 4th of July and feel this is still limiting you make an appointment and we can drain and inject the cyst. Otherwise follow up with me as needed.

## 2017-10-01 NOTE — Progress Notes (Signed)
PCP: Rutherford Guys, MD  Subjective:   HPI: Patient is a 72 y.o. male here for left knee pain.  Patient reports for about 6 weeks he's had pain, swelling posterior left knee. He enjoys exercising and has limited amount of weight bearing exercise he's done past 4 weeks as a result of the discomfort. Does ok with biking, feels better as he's exercising. Has history of quad tendon ruptures in 2014 that required surgery by Dr. Alvan Dame. Pain is sharp, worse with movement, better with knee extended. Tried ibuprofen which helps. Icing without much help - heat helped only temporarily Tried topical medications, horse linament. Has an upcoming trip to [place]. No skin changes, numbness.  Past Medical History:  Diagnosis Date  . Cancer [facility])    prostate  . Cataract   . Sebaceous cyst    left arm    No current outpatient medications on file prior to visit.   No current facility-administered medications on file prior to visit.     Past Surgical History:  Procedure Laterality Date  . [place]   left  . EYE SURGERY    . [place]  2010   right  . MOUTH SURGERY    . QUADRICEPS REPAIR Bilateral   . thumb surgery  2011   left  . VASECTOMY      No Known Allergies  Social History   Socioeconomic History  . Marital status: Married    Spouse name: Not on file  . Number of children: 2  . Years of education: Not on file  . Highest education level: Bachelor's degree (e.g., BA, AB, BS)  Occupational History  . Not on file  Social Needs  . Financial resource strain: Not hard at all  . Food insecurity:    Worry: Never true    Inability: Never true  . Transportation needs:    Medical: No    Non-medical: No  Tobacco Use  . Smoking status: Never Smoker  . Smokeless tobacco: Never Used  Substance and Sexual Activity  . Alcohol use: Yes    Alcohol/week: 0.6 oz    Types: 1 Standard drinks or equivalent per week  . Drug use: No  .  Sexual activity: Yes  Lifestyle  . Physical activity:    Days per week: 7 days    Minutes per session: 40 min  . Stress: Only a little  Relationships  . Social connections:    Talks on phone: More than three times a week    Gets together: More than three times a week    Attends religious service: More than 4 times per year    Active member of club or organization: No    Attends meetings of clubs or organizations: Never    Relationship status: Married  . Intimate partner violence:    Fear of current or ex partner: No    Emotionally abused: No    Physically abused: No    Forced sexual activity: No  Other Topics Concern  . Not on file  Social History Narrative   Retired, previously works for [employer] all over the country and [employer]     Family History  Problem Relation Age of Onset  . Cancer Mother        leukemia  . Heart disease Father   . Cancer Brother        prostate  . Hyperlipidemia Brother   . Healthy Sister   .  Cancer Paternal Grandfather        colon    BP 112/64   Ht 6\' 5"  (1.956 m)   Wt 215 lb (97.5 kg)   BMI 25.50 kg/m   Review of Systems: See HPI above.     Objective:  Physical Exam:  Gen: NAD, comfortable in exam room  Left knee: Fullness posterior knee.  No other gross deformity, ecchymoses.  Mild effusion. TTP mildly medial joint line, post patellar facets. FROM with 5/5 strength. Negative ant/post drawers. Negative valgus/varus testing. Negative lachmanns. Negative mcmurrays, apleys, patellar apprehension. NV intact distally.  Right knee: No deformity. FROM with 5/5 strength. No tenderness to palpation. NVI distally.   MSK u/s left knee:  Mild effusion confirmed.  Mild arthropathy medial, lateral, and patellofemoral compartments.  Large bakers cyst noted medially.  Assessment & Plan:  1. 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  Likely caused by his mild arthritis.   Discussed options - will treat conservatively with icing, naproxen twice a day, compression sleeve, elevation.  Consider aspiration/injection if still bothersome and not improving prior to his upcoming trip.

## 2017-10-01 NOTE — Assessment & Plan Note (Signed)
symptoms consistent with being caused by large baker's cyst of his knee.  Likely caused by his mild arthritis.  Discussed options - will treat conservatively with icing, naproxen twice a day, compression sleeve, elevation.  Consider aspiration/injection if still bothersome and not improving prior to his upcoming trip.

## 2017-10-29 ENCOUNTER — Ambulatory Visit: Payer: Medicare Other | Admitting: Family Medicine

## 2017-10-29 VITALS — BP 118/64 | Ht 77.0 in | Wt 215.0 lb

## 2017-10-29 DIAGNOSIS — G8929 Other chronic pain: Secondary | ICD-10-CM | POA: Diagnosis not present

## 2017-10-29 DIAGNOSIS — M25562 Pain in left knee: Secondary | ICD-10-CM | POA: Diagnosis not present

## 2017-10-29 DIAGNOSIS — R600 Localized edema: Secondary | ICD-10-CM | POA: Insufficient documentation

## 2017-10-29 MED ORDER — METHYLPREDNISOLONE ACETATE 40 MG/ML IJ SUSP
40.0000 mg | Freq: Once | INTRAMUSCULAR | Status: AC
  Administered 2017-10-29: 40 mg via INTRA_ARTICULAR

## 2017-10-29 NOTE — Progress Notes (Signed)
Gordon Ellis is a 72 y.o. male who is here for follow up of left knee pain and baker's cyst, and concern for lower extremity edema.  HPI:  Gordon Ellis is a 72 y.o. male with hx of left knee pain, quad tendon ruptures, BPH, and ectatic abdominal aorta who comes in for left knee pain, reevaluation of left baker's cyst, and worsening lower extremity edema.  1) Left Knee pain-since last visit, patient reports left knee pain is a little bit better.  However continues to be painful with walking. Estimates he can walk 300 yards before needing to limp.  He is worried because he is supposed to go to [place] in 1 week and will be doing lots of walking.  Describes diffuse pain felt at both the anteromedial side of the knee as well as posterior where his cyst is.  Pain increases with going downstairs but not upstairs.  Denies any instability, buckling, locking, or popping. Uses his bike regularly without pain. Believes that cyst is smaller than at last visit but knee still feels tight. Does notice intermittent diffuse swelling of knee after prolonged walking. No erythema. Continues to do quad strengthening and flexibility exercises at home. Is taking naproxen as instructed. Ices his knee occasionally. Has tried Voltaren topical in the past but not much relief of pain.  2) Lower extremity swelling-patient reports worsening of ankle and foot swelling since his last visit, left greater than right.  No accompanying pain, numbness, or tingling in his ankles or feet.  Notices more after being on his feet for prolonged periods.  Has been wearing varying sizes of compression socks, currently the kind without toes. No known foot or ankle injury. No calf pain or redness.  Patient was last seen in sports med on 10/01/2017 for left knee pain. Pain thought secondary to large Baker's cyst of left knee as well as mild arthritis.  Recommended to treat with ice, naproxen twice daily, compression sleeve, and elevation.  No injection  done at that visit.    Review of systems:  As stated above   Interval past medical history, surgical history, family history, and social history obtained and reviewed.  Patient Active Problem List   Diagnosis Date Noted  . Left knee pain 10/01/2017  . 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 Nocturia 01/22/2016  . Urinary urgency 01/22/2016  . BPH (benign prostatic hyperplasia) 10/05/2014  . ED (erectile dysfunction) 10/05/2014  . Cataract extraction status 04/08/2013  . Prostate CA ([place]) 04/08/2013  . Basal cell carcinoma 04/16/2012    Physical Exam:  BP 118/64   Ht 6\' 5"  (1.956 m)   Wt 215 lb (97.5 kg)   BMI 25.50 kg/m    Physical Exam Physical exam: Vital signs are reviewed and are documented in the chart Gen.: Alert, oriented, appears stated age, in no apparent distress HEENT: Moist oral mucosa Respiratory: Normal respirations, able to speak in full sentences Cardiac: Regular rate, distal pulses 2+ Integumentary: No rashes on visible skin:  Neurologic: Strength 5/5 with bilateral flexion and extension, as well as bilateral ankle dorsiflexion, plantar flexion, inversion, eversion, sensation intact throughout bilateral lower extremities  Psych: Normal affect, mood is described as good Musculoskeletal: Surgical scars on anterior knees bilaterally. No erythema or ecchymosis of either knee.  Left knee: Palpable effusion and cyst/fullness on posterior left knee which is tender to palpation.  Also tender to palpation on medial joint line. Decreased knee flexion with accompanying discomfort. No crepitus. Full extension. No pain with valgus  or varus testing. No signs of ligamentous instability: negative ant/post drawer, negative Lachman's, negative Lever, negative apprehension. Positive Thessaly's on medial left meniscus.  Right knee: No effusion. No tenderness. Decreased knee flexion but better than right and without discomfort. No crepitus. Full extension. No pain with  valgus or varus testing. No signs of ligamentous instability: negative ant/post drawer, negative Lachman's, negative Lever, negative apprehension. Negative Thessaly's.  Bilateral ankles and feet: 1-2+ pitting edema from lower calf to toes (left lower leg worse than right), non tender. Toes and distal forefoot are more swollen than midfoot due to the type of compression socks he had on. No asymmetric calf swelling, redness, or tenderness. Full ROM in all directions. Normal movement of toes. No calf pain with foot dorsifexion. No bony tenderness on feet or ankles. DP and TP pulses strong bilaterally. Cap refill<3sec.  Assessment/Plan: Gordon Ellis is an active 72 y.o. male with hx of left knee pain and quad tendon ruptures who comes to sports medicine for continued left knee pain and worsening lower extremity edema. Knee exam had effusion and enlargement of Baker's Cyst which may be a significant contribution to his pain. He has exam findings to suggest left medial meniscal changes which, in association with his arthritis, likely explain his anteromedial knee pain, but ligaments are intact with a stable knee. His bilateral lower extremity pitting edema is most likely dependent edema from venous insufficiency, rather than a new musculoskeletal cause. Distal perfusion is intact without concerns on exam for a thrombosis. Baker's Cyst was aspirated today, with 38ml of fluid removed and corticosteroid injected, so hopeful that he will get some relief from this treatment. Recommend symptomatic treatment of his edema as well. No new imaging required today.  1) Baker's Cyst with left knee pain -continue tylenol and naproxen, topicals if helpful  After informed written consent timeout was performed, patient was lying prone on exam table.  Left knee was prepped with alcohol swab.  3 mL of marcaine was used for local anesthesia.  Then using an 18g needle on 60cc syringe utilizing posterior approach with ultrasound  guidance, 40 mL of clear straw-colored fluid was aspirated from bakers cyst of left knee.  Knee was then injected with 3:1 marcaine:depomedrol.  Patient tolerated procedure well without immediate complications  2) Bilateral lower extremity edema -elevate legs when possible -recommend compression socks (including toes), especially with long car ride and upcoming plane trip  Follow up: PRN for new or worsening symptoms   Thereasa Distance, MD, [facility] Primary Care Pediatrics PGY3

## 2017-10-29 NOTE — Patient Instructions (Signed)
Your pain is due to arthritis, a large baker's cyst. These are the different medications you can take for this: Tylenol 500mg  1-2 tabs three times a day for pain. Capsaicin, aspercreme, or biofreeze topically up to four times a day may also help with pain. Some supplements that may help for arthritis: Boswellia extract, curcumin, pycnogenol Aleve 1-2 tabs twice a day with food Cortisone injections are an option - you were given this today after aspiration of the cyst. It's important that you continue to stay active. Straight leg raises, knee extensions 3 sets of 10 once a day (add ankle weight if these become too easy). Consider physical therapy to strengthen muscles around the joint that hurts to take pressure off of the joint itself. Shoe inserts with good arch support may be helpful. Ice 15 minutes at a time 3-4 times a day as needed to help with pain. Water aerobics and cycling with low resistance are the best two types of exercise for arthritis though any exercise is ok as long as it doesn't worsen the pain. Follow up with me in 6 weeks or as needed. Have a great time on your trip!

## 2017-11-01 ENCOUNTER — Encounter: Payer: Self-pay | Admitting: Family Medicine

## 2017-11-01 NOTE — Assessment & Plan Note (Signed)
-  continue tylenol and naproxen, topicals if helpful  After informed written consent timeout was performed, patient was lying prone on exam table.  Left knee was prepped with alcohol swab.  3 mL of marcaine was used for local anesthesia.  Then using an 18g needle on 60cc syringe utilizing posterior approach with ultrasound guidance, 40 mL of clear straw-colored fluid was aspirated from bakers cyst of left knee.  Knee was then injected with 3:1 marcaine:depomedrol.  Patient tolerated procedure well without immediate complications

## 2017-11-01 NOTE — Assessment & Plan Note (Signed)
-  elevate legs when possible -recommend compression socks (including toes), especially with long car ride and upcoming plane trip

## 2018-03-03 ENCOUNTER — Encounter: Payer: Medicare Other | Admitting: Family Medicine

## 2018-04-07 ENCOUNTER — Encounter: Payer: Self-pay | Admitting: Family Medicine

## 2018-04-07 ENCOUNTER — Ambulatory Visit (INDEPENDENT_AMBULATORY_CARE_PROVIDER_SITE_OTHER): Payer: Medicare Other | Admitting: Family Medicine

## 2018-04-07 VITALS — BP 117/69 | HR 60 | Temp 98.3°F | Ht 77.0 in | Wt 226.4 lb

## 2018-04-07 DIAGNOSIS — Z0000 Encounter for general adult medical examination without abnormal findings: Secondary | ICD-10-CM | POA: Diagnosis not present

## 2018-04-07 DIAGNOSIS — Z1211 Encounter for screening for malignant neoplasm of colon: Secondary | ICD-10-CM | POA: Diagnosis not present

## 2018-04-07 DIAGNOSIS — E7800 Pure hypercholesterolemia, unspecified: Secondary | ICD-10-CM

## 2018-04-07 DIAGNOSIS — Z8546 Personal history of malignant neoplasm of prostate: Secondary | ICD-10-CM

## 2018-04-07 LAB — CBC
Hematocrit: 40.2 % (ref 37.5–51.0)
Hemoglobin: 13.9 g/dL (ref 13.0–17.7)
MCH: 30.9 pg (ref 26.6–33.0)
MCHC: 34.6 g/dL (ref 31.5–35.7)
MCV: 89 fL (ref 79–97)
Platelets: 149 10*3/uL — ABNORMAL LOW (ref 150–450)
RBC: 4.5 x10E6/uL (ref 4.14–5.80)
RDW: 12.7 % (ref 12.3–15.4)
WBC: 5.5 10*3/uL (ref 3.4–10.8)

## 2018-04-07 LAB — CMP14+EGFR
ALT: 21 IU/L (ref 0–44)
AST: 25 IU/L (ref 0–40)
Albumin/Globulin Ratio: 2.3 — ABNORMAL HIGH (ref 1.2–2.2)
Albumin: 4.3 g/dL (ref 3.5–4.8)
Alkaline Phosphatase: 67 IU/L (ref 39–117)
BUN/Creatinine Ratio: 17 (ref 10–24)
BUN: 12 mg/dL (ref 8–27)
Bilirubin Total: 0.6 mg/dL (ref 0.0–1.2)
CO2: 20 mmol/L (ref 20–29)
Calcium: 9.1 mg/dL (ref 8.6–10.2)
Chloride: 101 mmol/L (ref 96–106)
Creatinine, Ser: 0.72 mg/dL — ABNORMAL LOW (ref 0.76–1.27)
GFR calc Af Amer: 108 mL/min/{1.73_m2} (ref >59)
GFR calc non Af Amer: 93 mL/min/{1.73_m2} (ref >59)
Globulin, Total: 1.9 g/dL (ref 1.5–4.5)
Glucose: 99 mg/dL (ref 65–99)
Potassium: 4.4 mmol/L (ref 3.5–5.2)
Sodium: 135 mmol/L (ref 134–144)
Total Protein: 6.2 g/dL (ref 6.0–8.5)

## 2018-04-07 LAB — LIPID PANEL
Chol/HDL Ratio: 2.9 ratio (ref 0.0–5.0)
Cholesterol, Total: 200 mg/dL — ABNORMAL HIGH (ref 100–199)
HDL: 69 mg/dL (ref >39)
LDL Calculated: 118 mg/dL — ABNORMAL HIGH (ref 0–99)
Triglycerides: 67 mg/dL (ref 0–149)
VLDL Cholesterol Cal: 13 mg/dL (ref 5–40)

## 2018-04-07 NOTE — Progress Notes (Signed)
Presents today for [employer] Visit-Subsequent.   Date of last exam: Dec 2018  Interpreter used for this visit? no  Patient Care Team: Rutherford Guys, MD as PCP - General (Family Medicine) Myrlene Broker, MD as Attending Physician (Urology) Warden Fillers, MD as Consulting Physician (Ophthalmology) Danella Sensing, MD as Consulting Physician (Dermatology)   Other items to address today: none   Cancer Screening: Cervical: n/a Breast: n/a Colon: due, Dr Cristina Gong, would like to referral Prostate: patient with h/o prostate cancer, managed by urology   Other Screening: Last screening for diabetes: 2018 Last lipid screening: 2018, LDL 112  ADVANCE DIRECTIVES: Discussed: yes Patient desires CPR (No ), mechanical ventilation (No ), prolonged artificial support (may include mechanical ventilation, tube/PEG feeding, etc) (No ). On File: no Materials Provided: no  Immunization status:  Immunization History  Administered Date(s) Administered  . Influenza Whole 01/20/2013  . Influenza,inj,Quad PF,6+ Mos 03/21/2014, 02/27/2016, 01/09/2017  . Pneumococcal Conjugate-13 05/05/2014  . Pneumococcal Polysaccharide-23 02/27/2016  . Tdap 12/22/2012  . Zoster 04/22/2013  . Zoster Recombinat (Shingrix) 07/09/2017, 10/23/2017   Had high dose flu vaccine this season at [facility] Maintenance Due  Topic Date Due  . COLONOSCOPY  04/01/2018   6CIT Screen 04/07/2018 04/03/2017  What Year? 0 points 0 points  What month? 0 points 0 points  What time? 0 points 0 points  Count back from 20 0 points 0 points  Months in reverse 0 points 0 points  Repeat phrase 0 points 0 points  Total Score 0 0     Functional Status Survey: Is the patient deaf or have difficulty hearing?: Yes(some in the left ear) Does the patient have difficulty seeing, even when wearing glasses/contacts?: (soes wear glasses, see Dr Katy Fitch) Does the patient have difficulty  concentrating, remembering, or making decisions?: No Does the patient have difficulty walking or climbing stairs?: No Does the patient have difficulty dressing or bathing?: No Does the patient have difficulty doing errands alone such as visiting a doctor's office or shopping?: No    Office Visit from 04/07/2018 in Primary Care at [facility]  AUDIT-C Score  2      Patient Active Problem List   Diagnosis Date Noted  . Lower extremity edema 10/29/2017  . Left knee pain 10/01/2017  . 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 Nocturia 01/22/2016  . Urinary urgency 01/22/2016  . BPH (benign prostatic hyperplasia) 10/05/2014  . ED (erectile dysfunction) 10/05/2014  . Cataract extraction status 04/08/2013  . Prostate CA ([place]) 04/08/2013  . Basal cell carcinoma 04/16/2012     Past Medical History:  Diagnosis Date  . Cancer [facility])    prostate  . Cataract   . Sebaceous cyst    left arm     Past Surgical History:  Procedure Laterality Date  . [place]   left  . EYE SURGERY    . [place]  2010   right  . MOUTH SURGERY    . QUADRICEPS REPAIR Bilateral   . thumb surgery  2011   left  . VASECTOMY       Family History  Problem Relation Age of Onset  . Cancer Mother        leukemia  . Heart disease Father   . Cancer Brother        prostate  . Hyperlipidemia Brother   . Healthy Sister   . Cancer  Paternal Grandfather        colon     Social History   Socioeconomic History  . Marital status: Married    Spouse name: Not on file  . Number of children: 2  . Years of education: Not on file  . Highest education level: Bachelor's degree (e.g., BA, AB, BS)  Occupational History  . Not on file  Social Needs  . Financial resource strain: Not hard at all  . Food insecurity:    Worry: Never true    Inability: Never true  . Transportation needs:    Medical: No    Non-medical: No  Tobacco Use  . Smoking status: Never Smoker    . Smokeless tobacco: Never Used  Substance and Sexual Activity  . Alcohol use: Yes    Alcohol/week: 1.0 standard drinks    Types: 1 Standard drinks or equivalent per week  . Drug use: No  . Sexual activity: Yes  Lifestyle  . Physical activity:    Days per week: 7 days    Minutes per session: 40 min  . Stress: Only a little  Relationships  . Social connections:    Talks on phone: More than three times a week    Gets together: More than three times a week    Attends religious service: More than 4 times per year    Active member of club or organization: No    Attends meetings of clubs or organizations: Never    Relationship status: Married  . Intimate partner violence:    Fear of current or ex partner: No    Emotionally abused: No    Physically abused: No    Forced sexual activity: No  Other Topics Concern  . Not on file  Social History Narrative   Retired, previously works for [employer] all over the country and [employer]      No Known Allergies   Prior to Admission medications   Not on File     Depression screen [facility] 2/9 09/03/2017 04/03/2017 01/06/2017 07/23/2016 02/27/2016  Decreased Interest 0 0 0 0 0  Down, Depressed, Hopeless 0 0 0 0 0  PHQ - 2 Score 0 0 0 0 0     Fall Risk  09/03/2017 04/03/2017 01/06/2017 07/23/2016 02/27/2016  Falls in the past year? No No Yes No No  Number falls in past yr: - - 1 - -  Injury with Fall? - - Yes - -  Comment - - right knee scratches/bruises - -    Review of Systems  Constitutional: Negative for chills and fever.  Respiratory: Negative for cough and shortness of breath.   Cardiovascular: Negative for chest pain, palpitations and leg swelling.  Gastrointestinal: Negative for abdominal pain, nausea and vomiting.  Genitourinary: Negative for dysuria and hematuria.  Musculoskeletal: Positive for joint pain (left knee).  All other systems reviewed and are negative.   PHYSICAL EXAM: BP 117/69 (BP Location: Left Arm,  Patient Position: Sitting, Cuff Size: Large)   Pulse 60   Temp 98.3 F (36.8 C) (Oral)   Ht _0  (1.956 m)   Wt 226 lb 6.4 oz (102.7 kg)   SpO2 96%   BMI 26.85 kg/m    Wt Readings from Last 3 Encounters:  04/07/18 226 lb 6.4 oz (102.7 kg)  10/29/17 215 lb (97.5 kg)  10/01/17 215 lb (97.5 kg)   His dog died, so does not walk as much He still bicycles a lot   Visual Acuity Screening  Right eye Left eye Both eyes  Without correction: _0  With correction:      Only uses reading glasses, sees Dr Katy Fitch yearly, has been referred to specialist due to concern for "spot" in eye, need to rule out malignancy, low chance, sees in Jan 2020  Physical Exam  Constitutional: He is oriented to person, place, and time and well-developed, well-nourished, and in no distress.  HENT:  Head: Normocephalic and atraumatic.  Right Ear: Hearing, tympanic membrane, external ear and ear canal normal.  Left Ear: Hearing, tympanic membrane, external ear and ear canal normal.  Mouth/Throat: Oropharynx is clear and moist. No oropharyngeal exudate.  Eyes: Pupils are equal, round, and reactive to light. Conjunctivae and EOM are normal.  Neck: Neck supple. No thyromegaly present.  Cardiovascular: Normal rate, regular rhythm, normal heart sounds and intact distal pulses. Exam reveals no gallop and no friction rub.  No murmur heard. Pulmonary/Chest: Effort normal and breath sounds normal. He has no wheezes. He has no rales.  Abdominal: Soft. Bowel sounds are normal. He exhibits no distension and no mass. There is no abdominal tenderness.  Musculoskeletal: Normal range of motion.        General: No edema.  Lymphadenopathy:    He has no cervical adenopathy.  Neurological: He is alert and oriented to person, place, and time. He has normal reflexes. No cranial nerve deficit. Gait normal.  Skin: Skin is warm and dry.  Psychiatric: Mood and affect normal.  Nursing note and vitals  reviewed.    Education/Counseling provided regarding diet and exercise, prevention of chronic diseases, smoking/tobacco cessation, if applicable, and reviewed "Covered Medicare Preventive Services."   ASSESSMENT/PLAN:  1. Encounter for Medicare annual wellness exam No concerns per history or exam. Routine HCM labs ordered. HCM reviewed/discussed. Anticipatory guidance regarding healthy weight, lifestyle and choices given.   2. Pure hypercholesterolemia - Lipid panel - TSH  3. History of prostate cancer - CMP14+EGFR - CBC  4. Colon cancer screening - Ambulatory referral to Gastroenterology

## 2018-04-07 NOTE — Patient Instructions (Addendum)
   If you have lab work done today you will be contacted with your lab results within the next 2 weeks.  If you have not heard from us then please contact us. The fastest way to get your results is to register for My Chart.   IF you received an x-ray today, you will receive an invoice from [facility]. Please contact [facility] at [phone] with questions or concerns regarding your invoice.   IF you received labwork today, you will receive an invoice from [facility]. Please contact [facility] at [phone] with questions or concerns regarding your invoice.   Our billing staff will not be able to assist you with questions regarding bills from these companies.  You will be contacted with the lab results as soon as they are available. The fastest way to get your results is to activate your My Chart account. Instructions are located on the last page of this paperwork. If you have not heard from us regarding the results in 2 weeks, please contact this office.     Preventive Care 73 Years and Older, Male Preventive care refers to lifestyle choices and visits with your health care provider that can promote health and wellness. What does preventive care include?  A yearly physical exam. This is also called an annual well check.  Dental exams once or twice a year.  Routine eye exams. Ask your health care provider how often you should have your eyes checked.  Personal lifestyle choices, including: ? Daily care of your teeth and gums. ? Regular physical activity. ? Eating a healthy diet. ? Avoiding tobacco and drug use. ? Limiting alcohol use. ? Practicing safe sex. ? Taking low doses of aspirin every day. ? Taking vitamin and mineral supplements as recommended by your health care provider. What happens during an annual well check? The services and screenings done by your health care provider during your annual well check will depend on your age, overall health,  lifestyle risk factors, and family history of disease. Counseling Your health care provider may ask you questions about your:  Alcohol use.  Tobacco use.  Drug use.  Emotional well-being.  Home and relationship well-being.  Sexual activity.  Eating habits.  History of falls.  Memory and ability to understand (cognition).  Work and work environment.  Screening You may have the following tests or measurements:  Height, weight, and BMI.  Blood pressure.  Lipid and cholesterol levels. These may be checked every 5 years, or more frequently if you are over 50 years old.  Skin check.  Lung cancer screening. You may have this screening every year starting at age 55 if you have a 30-pack-year history of smoking and currently smoke or have quit within the past 15 years.  Fecal occult blood test (FOBT) of the stool. You may have this test every year starting at age 50.  Flexible sigmoidoscopy or colonoscopy. You may have a sigmoidoscopy every 5 years or a colonoscopy every 10 years starting at age 50.  Prostate cancer screening. Recommendations will vary depending on your family history and other risks.  Hepatitis C blood test.  Hepatitis B blood test.  Sexually transmitted disease (STD) testing.  Diabetes screening. This is done by checking your blood sugar (glucose) after you have not eaten for a while (fasting). You may have this done every 1-3 years.  Abdominal aortic aneurysm (AAA) screening. You may need this if you are a current or former smoker.  Osteoporosis. You may be screened   starting at age 73 if you are at high risk.  Talk with your health care provider about your test results, treatment options, and if necessary, the need for more tests. Vaccines Your health care provider may recommend certain vaccines, such as:  Influenza vaccine. This is recommended every year.  Tetanus, diphtheria, and acellular pertussis (Tdap, Td) vaccine. You may need a Td  booster every 10 years.  Varicella vaccine. You may need this if you have not been vaccinated.  Zoster vaccine. You may need this after age 29.  Measles, mumps, and rubella (MMR) vaccine. You may need at least one dose of MMR if you were born in 1957 or later. You may also need a second dose.  Pneumococcal 13-valent conjugate (PCV13) vaccine. One dose is recommended after age 6.  Pneumococcal polysaccharide (PPSV23) vaccine. One dose is recommended after age 51.  Meningococcal vaccine. You may need this if you have certain conditions.  Hepatitis A vaccine. You may need this if you have certain conditions or if you travel or work in places where you may be exposed to hepatitis A.  Hepatitis B vaccine. You may need this if you have certain conditions or if you travel or work in places where you may be exposed to hepatitis B.  Haemophilus influenzae type b (Hib) vaccine. You may need this if you have certain risk factors.  Talk to your health care provider about which screenings and vaccines you need and how often you need them. This information is not intended to replace advice given to you by your health care provider. Make sure you discuss any questions you have with your health care provider. Document Released: 05/05/2015 Document Revised: 12/27/2015 Document Reviewed: 02/07/2015 Elsevier Interactive Patient Education  [employer].

## 2018-04-08 LAB — TSH: TSH: 1.18 u[IU]/mL (ref 0.450–4.500)

## 2018-04-20 ENCOUNTER — Telehealth: Payer: Self-pay | Admitting: Family Medicine

## 2018-04-20 NOTE — Telephone Encounter (Unsigned)
Copied from [place] [id number]. Topic: General - Other >> Apr 20, 2018 11:42 AM Parke Poisson wrote: Reason for CRM: Pt would like to get result from his lab work

## 2018-04-20 NOTE — Telephone Encounter (Signed)
Sent lab release note for patient to be called with results

## 2018-04-21 NOTE — Telephone Encounter (Signed)
Patient called to say that he would like a copy of his labs sent to him at his address on file by [place] mail please as soon as possible.

## 2018-04-24 NOTE — Telephone Encounter (Signed)
Mailed lab results to pt's home address.

## 2018-06-26 ENCOUNTER — Telehealth: Payer: Self-pay | Admitting: Family Medicine

## 2018-06-26 NOTE — Telephone Encounter (Signed)
Copied from [place] [id number]. Topic: General - Other >> Jun 26, 2018 11:03 AM Carolyn Stare wrote:  Pt calling to ask for a letter to release him from his flight with [place], pt does not want to fly with this virus going around

## 2018-06-26 NOTE — Telephone Encounter (Signed)
Advised pt that unfortunately we can not provide a letter at this time but if he starts to have any symptoms to give [place] a call to schedule an appointment.

## 2018-09-13 ENCOUNTER — Emergency Department (HOSPITAL_COMMUNITY)
Admission: EM | Admit: 2018-09-13 | Discharge: 2018-09-13 | Disposition: A | Discharge status: Home / Self Care | Payer: Medicare Other | Attending: Emergency Medicine | Admitting: Emergency Medicine

## 2018-09-13 ENCOUNTER — Encounter (HOSPITAL_COMMUNITY): Payer: Self-pay

## 2018-09-13 ENCOUNTER — Emergency Department (HOSPITAL_COMMUNITY): Payer: Medicare Other

## 2018-09-13 ENCOUNTER — Other Ambulatory Visit: Payer: Self-pay

## 2018-09-13 DIAGNOSIS — Z8546 Personal history of malignant neoplasm of prostate: Secondary | ICD-10-CM | POA: Diagnosis not present

## 2018-09-13 DIAGNOSIS — Y9355 Activity, bike riding: Secondary | ICD-10-CM | POA: Insufficient documentation

## 2018-09-13 DIAGNOSIS — T148XXA Other injury of unspecified body region, initial encounter: Secondary | ICD-10-CM

## 2018-09-13 DIAGNOSIS — Y999 Unspecified external cause status: Secondary | ICD-10-CM | POA: Diagnosis not present

## 2018-09-13 DIAGNOSIS — Y998 Other external cause status: Secondary | ICD-10-CM | POA: Insufficient documentation

## 2018-09-13 DIAGNOSIS — Y92410 Unspecified street and highway as the place of occurrence of the external cause: Secondary | ICD-10-CM | POA: Insufficient documentation

## 2018-09-13 DIAGNOSIS — Z85828 Personal history of other malignant neoplasm of skin: Secondary | ICD-10-CM | POA: Insufficient documentation

## 2018-09-13 DIAGNOSIS — S0083XA Contusion of other part of head, initial encounter: Secondary | ICD-10-CM | POA: Insufficient documentation

## 2018-09-13 DIAGNOSIS — S0990XA Unspecified injury of head, initial encounter: Secondary | ICD-10-CM | POA: Diagnosis present

## 2018-09-13 DIAGNOSIS — S50311A Abrasion of right elbow, initial encounter: Secondary | ICD-10-CM | POA: Insufficient documentation

## 2018-09-13 MED ORDER — BACITRACIN ZINC 500 UNIT/GM EX OINT
TOPICAL_OINTMENT | Freq: Once | CUTANEOUS | Status: AC
  Administered 2018-09-13: 17:00:00 via TOPICAL
  Filled 2018-09-13: qty 0.9
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
## 2018-09-13 NOTE — ED Notes (Signed)
Pt ambulatory without assistance to the bathroom
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
## 2018-09-13 NOTE — Discharge Instructions (Signed)
You can take 1000 mg of Tylenol.  Do not exceed 4000 mg of Tylenol a day.  Your CT scan was reassuring.  You may experience a concussion given head injury.  As we discussed, you should limit the amount of physical activity for 2 weeks, including but not limited to exercise, physical exertion.  Additionally, he should engage in brain rest with limits amount of screen time, phone use, TV use.  Follow-up with your primary care doctor in 5 to 7 days.  Return emergency department for any worsening headache, vision changes, numbness/weakness of arms or legs, vomiting, difficulty breathing, chest pain or any other worsening concerning symptoms.

## 2019-04-06 ENCOUNTER — Ambulatory Visit: Payer: Medicare Other | Admitting: Family Medicine

## 2019-04-12 ENCOUNTER — Encounter: Payer: Self-pay | Admitting: Family Medicine

## 2019-04-12 ENCOUNTER — Other Ambulatory Visit: Payer: Self-pay

## 2019-04-12 ENCOUNTER — Ambulatory Visit (INDEPENDENT_AMBULATORY_CARE_PROVIDER_SITE_OTHER): Payer: Medicare Other | Admitting: Family Medicine

## 2019-04-12 VITALS — BP 131/86 | HR 67 | Temp 98.0°F | Ht 77.0 in | Wt 226.8 lb

## 2019-04-12 DIAGNOSIS — Z8546 Personal history of malignant neoplasm of prostate: Secondary | ICD-10-CM

## 2019-04-12 DIAGNOSIS — D696 Thrombocytopenia, unspecified: Secondary | ICD-10-CM | POA: Diagnosis not present

## 2019-04-12 DIAGNOSIS — E7800 Pure hypercholesterolemia, unspecified: Secondary | ICD-10-CM

## 2019-04-12 DIAGNOSIS — Z0001 Encounter for general adult medical examination with abnormal findings: Secondary | ICD-10-CM

## 2019-04-12 DIAGNOSIS — Z0000 Encounter for general adult medical examination without abnormal findings: Secondary | ICD-10-CM

## 2019-04-12 DIAGNOSIS — N401 Enlarged prostate with lower urinary tract symptoms: Secondary | ICD-10-CM

## 2019-04-12 DIAGNOSIS — R350 Frequency of micturition: Secondary | ICD-10-CM

## 2019-04-12 NOTE — Progress Notes (Signed)
Presents today for [employer] Visit-Subsequent.   Date of last exam: Apr 07 2018  Interpreter used for this visit? n/a  Patient Care Team: Rutherford Guys, MD as PCP - General (Family Medicine) Myrlene Broker, MD as Attending Physician (Urology) Warden Fillers, MD as Consulting Physician (Ophthalmology) Danella Sensing, MD as Consulting Physician (Dermatology)   Other items to address today: none  Cancer Screening: Colon: due, he will schedule appt with Dr Cristina Gong  Prostate: h/o prostate cancer, has not seen Dr Rosana Hoes in almost a year, requesting PSA and UA be checked   Other Screening: Last screening for diabetes: 2019 Last lipid screening: HLP, he has gained some weight despite daily 3 miles walks and long bike rides Fasting today  Lab Results  Component Value Date   CHOL 200 (H) 04/07/2018   HDL 69 04/07/2018   LDLCALC 118 (H) 04/07/2018   TRIG 67 04/07/2018   CHOLHDL 2.9 04/07/2018    ADVANCE DIRECTIVES: Discussed: no On File: yes   Immunization status:  Immunization History  Administered Date(s) Administered  . Influenza Whole 01/20/2013  . Influenza,inj,Quad PF,6+ Mos 03/21/2014, 02/27/2016, 01/09/2017  . Pneumococcal Conjugate-13 05/05/2014  . Pneumococcal Polysaccharide-23 02/27/2016  . Tdap 12/22/2012  . Zoster 04/22/2013  . Zoster Recombinat (Shingrix) 07/09/2017, 10/23/2017     Health Maintenance Due  Topic Date Due  . COLONOSCOPY  04/01/2018  . INFLUENZA VACCINE  11/21/2018     Functional Status Survey: Is the patient deaf or have difficulty hearing?: No(SOMEWHAT) Does the patient have difficulty seeing, even when wearing glasses/contacts?: No Does the patient have difficulty concentrating, remembering, or making decisions?: No Does the patient have difficulty walking or climbing stairs?: No Does the patient have difficulty dressing or bathing?: No Does the patient have difficulty doing errands alone such as  visiting a doctor's office or shopping?: No   6CIT Screen 04/12/2019 04/07/2018 04/03/2017  What Year? 0 points 0 points 0 points  What month? 0 points 0 points 0 points  What time? 0 points 0 points 0 points  Count back from 20 0 points 0 points 0 points  Months in reverse 0 points 0 points 0 points  Repeat phrase 0 points 0 points 0 points  Total Score 0 0 0     Home Environment: married, no safety concerns  Urinary Incontinence Screening:  BPH with urinary frequency, nocturia x 3-4  Patient Active Problem List   Diagnosis Date Noted  . Lower extremity edema 10/29/2017  . Left knee pain 10/01/2017  . 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 Nocturia 01/22/2016  . Urinary urgency 01/22/2016  . BPH (benign prostatic hyperplasia) 10/05/2014  . ED (erectile dysfunction) 10/05/2014  . Cataract extraction status 04/08/2013  . Prostate CA ([place]) 04/08/2013  . Basal cell carcinoma 04/16/2012     Past Medical History:  Diagnosis Date  . Cancer [facility])    prostate  . Cataract   . Sebaceous cyst    left arm     Past Surgical History:  Procedure Laterality Date  . [place]   left  . EYE SURGERY    . [place]  2010   right  . MOUTH SURGERY    . QUADRICEPS REPAIR Bilateral   . thumb surgery  2011   left  . VASECTOMY       Family History  Problem Relation Age of Onset  . Cancer Mother  leukemia  . Heart disease Father   . Cancer Brother        prostate  . Hyperlipidemia Brother   . Healthy Sister   . Cancer Paternal Grandfather        colon     Social History   Socioeconomic History  . Marital status: Married    Spouse name: Not on file  . Number of children: 2  . Years of education: Not on file  . Highest education level: Bachelor's degree (e.g., BA, AB, BS)  Occupational History  . Not on file  Tobacco Use  . Smoking status: Never Smoker  . Smokeless tobacco: Never Used  Substance and Sexual  Activity  . Alcohol use: Yes    Alcohol/week: 1.0 standard drinks    Types: 1 Standard drinks or equivalent per week  . Drug use: No  . Sexual activity: Yes  Other Topics Concern  . Not on file  Social History Narrative   Retired, previously works for [employer] all over the country and [employer]    Social Determinants of Radio broadcast assistant Strain:   . Difficulty of Paying Living Expenses: Not on file  Food Insecurity:   . Worried About Charity fundraiser in the Last Year: Not on file  . Ran Out of Food in the Last Year: Not on file  Transportation Needs:   . Lack of Transportation (Medical): Not on file  . Lack of Transportation (Non-Medical): Not on file  Physical Activity:   . Days of Exercise per Week: Not on file  . Minutes of Exercise per Session: Not on file  Stress:   . Feeling of Stress : Not on file  Social Connections:   . Frequency of Communication with Friends and Family: Not on file  . Frequency of Social Gatherings with Friends and Family: Not on file  . Attends Religious Services: Not on file  . Active Member of Clubs or Organizations: Not on file  . Attends Archivist Meetings: Not on file  . Marital Status: Not on file  Intimate Partner Violence:   . Fear of Current or Ex-Partner: Not on file  . Emotionally Abused: Not on file  . Physically Abused: Not on file  . Sexually Abused: Not on file     No Known Allergies   Prior to Admission medications   Medication Sig Start Date End Date Taking? Authorizing Provider  Cyanocobalamin (VITAMIN B 12 PO) Take 1 tablet by mouth daily.   Yes [provider]  Omega-3 Fatty Acids (FISH OIL PO) Take 1 tablet by mouth daily.   Yes [provider]  Thiamine HCl (VITAMIN B-1 PO) Take 1 tablet by mouth daily.   Yes [provider]  VITAMIN D PO Take 1 tablet by mouth daily.   Yes [provider]     Depression screen [facility] 2/9 04/12/2019 09/03/2017  04/03/2017 01/06/2017 07/23/2016  Decreased Interest 0 0 0 0 0  Down, Depressed, Hopeless 0 0 0 0 0  PHQ - 2 Score 0 0 0 0 0     Fall Risk  04/12/2019 09/03/2017 04/03/2017 01/06/2017 07/23/2016  Falls in the past year? 0 No No Yes No  Number falls in past yr: 0 - - 1 -  Injury with Fall? 0 - - Yes -  Comment - - - right knee scratches/bruises -      PHYSICAL EXAM: BP 131/86   Pulse 67   Temp 98 F (  36.7 C)   Ht 6\' 5"  (1.956 m)   Wt 226 lb 12.8 oz (102.9 kg)   SpO2 97%   BMI 26.89 kg/m    Wt Readings from Last 3 Encounters:  04/12/19 226 lb 12.8 oz (102.9 kg)  09/13/18 220 lb (99.8 kg)  04/07/18 226 lb 6.4 oz (102.7 kg)     No exam data present    Physical Exam   Education/Counseling provided regarding diet and exercise, prevention of chronic diseases, smoking/tobacco cessation, if applicable, and reviewed "Covered Medicare Preventive Services."   ASSESSMENT/PLAN:  1. Encounter for Medicare annual wellness exam No concerns per history or exam. Routine HCM labs ordered. HCM reviewed/discussed. Anticipatory guidance regarding healthy weight, lifestyle and choices given.   2. Pure hypercholesterolemia - TSH - Lipid panel - Comprehensive metabolic panel  3. Thrombocytopenia (HCC) - CBC  4. History of prostate cancer - PSA  5. Benign prostatic hyperplasia with urinary frequency - Urinalysis, Routine w reflex microscopic  Return in about 1 year (around 04/11/2020).

## 2019-04-12 NOTE — Patient Instructions (Addendum)
   If you have lab work done today you will be contacted with your lab results within the next 2 weeks.  If you have not heard from us then please contact us. The fastest way to get your results is to register for My Chart.   IF you received an x-ray today, you will receive an invoice from [facility]. Please contact [facility] at [phone] with questions or concerns regarding your invoice.   IF you received labwork today, you will receive an invoice from [facility]. Please contact [facility] at [phone] with questions or concerns regarding your invoice.   Our billing staff will not be able to assist you with questions regarding bills from these companies.  You will be contacted with the lab results as soon as they are available. The fastest way to get your results is to activate your My Chart account. Instructions are located on the last page of this paperwork. If you have not heard from us regarding the results in 2 weeks, please contact this office.     Preventive Care 74 Years and Older, Male Preventive care refers to lifestyle choices and visits with your health care provider that can promote health and wellness. This includes:  A yearly physical exam. This is also called an annual well check.  Regular dental and eye exams.  Immunizations.  Screening for certain conditions.  Healthy lifestyle choices, such as diet and exercise. What can I expect for my preventive care visit? Physical exam Your health care provider will check:  Height and weight. These may be used to calculate body mass index (BMI), which is a measurement that tells if you are at a healthy weight.  Heart rate and blood pressure.  Your skin for abnormal spots. Counseling Your health care provider may ask you questions about:  Alcohol, tobacco, and drug use.  Emotional well-being.  Home and relationship well-being.  Sexual activity.  Eating habits.  History of  falls.  Memory and ability to understand (cognition).  Work and work environment. What immunizations do I need?  Influenza (flu) vaccine  This is recommended every year. Tetanus, diphtheria, and pertussis (Tdap) vaccine  You may need a Td booster every 10 years. Varicella (chickenpox) vaccine  You may need this vaccine if you have not already been vaccinated. Zoster (shingles) vaccine  You may need this after age 60. Pneumococcal conjugate (PCV13) vaccine  One dose is recommended after age 65. Pneumococcal polysaccharide (PPSV23) vaccine  One dose is recommended after age 65. Measles, mumps, and rubella (MMR) vaccine  You may need at least one dose of MMR if you were born in 1957 or later. You may also need a second dose. Meningococcal conjugate (MenACWY) vaccine  You may need this if you have certain conditions. Hepatitis A vaccine  You may need this if you have certain conditions or if you travel or work in places where you may be exposed to hepatitis A. Hepatitis B vaccine  You may need this if you have certain conditions or if you travel or work in places where you may be exposed to hepatitis B. Haemophilus influenzae type b (Hib) vaccine  You may need this if you have certain conditions. You may receive vaccines as individual doses or as more than one vaccine together in one shot (combination vaccines). Talk with your health care provider about the risks and benefits of combination vaccines. What tests do I need? Blood tests  Lipid and cholesterol levels. These may be checked every 5   years, or more frequently depending on your overall health.  Hepatitis C test.  Hepatitis B test. Screening  Lung cancer screening. You may have this screening every year starting at age 55 if you have a 30-pack-year history of smoking and currently smoke or have quit within the past 15 years.  Colorectal cancer screening. All adults should have this screening starting at age 50  and continuing until age 75. Your health care provider may recommend screening at age 45 if you are at increased risk. You will have tests every 1-10 years, depending on your results and the type of screening test.  Prostate cancer screening. Recommendations will vary depending on your family history and other risks.  Diabetes screening. This is done by checking your blood sugar (glucose) after you have not eaten for a while (fasting). You may have this done every 1-3 years.  Abdominal aortic aneurysm (AAA) screening. You may need this if you are a current or former smoker.  Sexually transmitted disease (STD) testing. Follow these instructions at home: Eating and drinking  Eat a diet that includes fresh fruits and vegetables, whole grains, lean protein, and low-fat dairy products. Limit your intake of foods with high amounts of sugar, saturated fats, and salt.  Take vitamin and mineral supplements as recommended by your health care provider.  Do not drink alcohol if your health care provider tells you not to drink.  If you drink alcohol: ? Limit how much you have to 0-2 drinks a day. ? Be aware of how much alcohol is in your drink. In the [place] one drink equals one 12 oz bottle of beer (355 mL), one 5 oz glass of wine (148 mL), or one 1 oz glass of hard liquor (44 mL). Lifestyle  Take daily care of your teeth and gums.  Stay active. Exercise for at least 30 minutes on 5 or more days each week.  Do not use any products that contain nicotine or tobacco, such as cigarettes, e-cigarettes, and chewing tobacco. If you need help quitting, ask your health care provider.  If you are sexually active, practice safe sex. Use a condom or other form of protection to prevent STIs (sexually transmitted infections).  Talk with your health care provider about taking a low-dose aspirin or statin. What's next?  Visit your health care provider once a year for a well check visit.  Ask your health care  provider how often you should have your eyes and teeth checked.  Stay up to date on all vaccines. This information is not intended to replace advice given to you by your health care provider. Make sure you discuss any questions you have with your health care provider. Document Released: 05/05/2015 Document Revised: 04/02/2018 Document Reviewed: 04/02/2018 Elsevier Patient Education  2020 [employer] Inc[employer]  

## 2019-04-13 ENCOUNTER — Encounter: Payer: Self-pay | Admitting: Radiology

## 2019-04-13 LAB — COMPREHENSIVE METABOLIC PANEL
ALT: 21 IU/L (ref 0–44)
AST: 24 IU/L (ref 0–40)
Albumin/Globulin Ratio: 2.5 — ABNORMAL HIGH (ref 1.2–2.2)
Albumin: 4.5 g/dL (ref 3.7–4.7)
Alkaline Phosphatase: 70 IU/L (ref 39–117)
BUN/Creatinine Ratio: 11 (ref 10–24)
BUN: 8 mg/dL (ref 8–27)
Bilirubin Total: 0.5 mg/dL (ref 0.0–1.2)
CO2: 22 mmol/L (ref 20–29)
Calcium: 9.1 mg/dL (ref 8.6–10.2)
Chloride: 101 mmol/L (ref 96–106)
Creatinine, Ser: 0.7 mg/dL — ABNORMAL LOW (ref 0.76–1.27)
GFR calc Af Amer: 107 mL/min/{1.73_m2} (ref >59)
GFR calc non Af Amer: 93 mL/min/{1.73_m2} (ref >59)
Globulin, Total: 1.8 g/dL (ref 1.5–4.5)
Glucose: 103 mg/dL — ABNORMAL HIGH (ref 65–99)
Potassium: 4.6 mmol/L (ref 3.5–5.2)
Sodium: 137 mmol/L (ref 134–144)
Total Protein: 6.3 g/dL (ref 6.0–8.5)

## 2019-04-13 LAB — URINALYSIS, ROUTINE W REFLEX MICROSCOPIC
Bilirubin, UA: NEGATIVE
Glucose, UA: NEGATIVE
Ketones, UA: NEGATIVE
Leukocytes,UA: NEGATIVE
Nitrite, UA: NEGATIVE
Protein,UA: NEGATIVE
RBC, UA: NEGATIVE
Specific Gravity, UA: 1.007 (ref 1.005–1.030)
Urobilinogen, Ur: 0.2 mg/dL (ref 0.2–1.0)
pH, UA: 7.5 (ref 5.0–7.5)

## 2019-04-13 LAB — LIPID PANEL
Chol/HDL Ratio: 2.9 ratio (ref 0.0–5.0)
Cholesterol, Total: 198 mg/dL (ref 100–199)
HDL: 68 mg/dL (ref >39)
LDL Chol Calc (NIH): 114 mg/dL — ABNORMAL HIGH (ref 0–99)
Triglycerides: 88 mg/dL (ref 0–149)
VLDL Cholesterol Cal: 16 mg/dL (ref 5–40)

## 2019-04-13 LAB — TSH: TSH: 1.05 u[IU]/mL (ref 0.450–4.500)

## 2019-04-13 LAB — CBC
Hematocrit: 42 % (ref 37.5–51.0)
Hemoglobin: 14.4 g/dL (ref 13.0–17.7)
MCH: 31.6 pg (ref 26.6–33.0)
MCHC: 34.3 g/dL (ref 31.5–35.7)
MCV: 92 fL (ref 79–97)
Platelets: 147 10*3/uL — ABNORMAL LOW (ref 150–450)
RBC: 4.56 x10E6/uL (ref 4.14–5.80)
RDW: 12.4 % (ref 11.6–15.4)
WBC: 6.7 10*3/uL (ref 3.4–10.8)

## 2019-04-13 LAB — PSA: Prostate Specific Ag, Serum: 4.2 ng/mL — ABNORMAL HIGH (ref 0.0–4.0)

## 2019-04-27 ENCOUNTER — Encounter: Payer: Self-pay | Admitting: Family Medicine

## 2019-05-20 LAB — HM COLONOSCOPY

## 2019-08-08 DIAGNOSIS — R972 Elevated prostate specific antigen [PSA]: Secondary | ICD-10-CM | POA: Insufficient documentation

## 2019-08-21 DIAGNOSIS — S060XAA Concussion with loss of consciousness status unknown, initial encounter: Secondary | ICD-10-CM

## 2019-08-21 HISTORY — DX: Concussion with loss of consciousness status unknown, initial encounter: S06.0XAA

## 2020-01-14 ENCOUNTER — Other Ambulatory Visit: Payer: Self-pay

## 2020-01-14 ENCOUNTER — Ambulatory Visit: Payer: Medicare Other | Admitting: Family Medicine

## 2020-01-14 DIAGNOSIS — Z23 Encounter for immunization: Secondary | ICD-10-CM

## 2020-04-11 ENCOUNTER — Encounter: Payer: Medicare Other | Admitting: Family Medicine

## 2020-05-03 ENCOUNTER — Ambulatory Visit: Payer: Medicare Other | Admitting: Family Medicine

## 2020-05-03 ENCOUNTER — Other Ambulatory Visit: Payer: Self-pay

## 2020-05-03 ENCOUNTER — Encounter: Payer: Self-pay | Admitting: Family Medicine

## 2020-05-03 VITALS — BP 124/70 | HR 63 | Temp 97.6°F | Ht 77.0 in | Wt 223.0 lb

## 2020-05-03 DIAGNOSIS — Z131 Encounter for screening for diabetes mellitus: Secondary | ICD-10-CM | POA: Diagnosis not present

## 2020-05-03 DIAGNOSIS — E7800 Pure hypercholesterolemia, unspecified: Secondary | ICD-10-CM | POA: Diagnosis not present

## 2020-05-03 DIAGNOSIS — N401 Enlarged prostate with lower urinary tract symptoms: Secondary | ICD-10-CM | POA: Diagnosis not present

## 2020-05-03 DIAGNOSIS — I77811 Abdominal aortic ectasia: Secondary | ICD-10-CM

## 2020-05-03 DIAGNOSIS — Z8546 Personal history of malignant neoplasm of prostate: Secondary | ICD-10-CM | POA: Diagnosis not present

## 2020-05-03 DIAGNOSIS — Z1329 Encounter for screening for other suspected endocrine disorder: Secondary | ICD-10-CM

## 2020-05-03 DIAGNOSIS — R350 Frequency of micturition: Secondary | ICD-10-CM

## 2020-05-03 DIAGNOSIS — D696 Thrombocytopenia, unspecified: Secondary | ICD-10-CM

## 2020-05-03 NOTE — Patient Instructions (Addendum)
If you continue to have urinary issues, I would consider trying low dose med as recommended by Dr. Rosana Hoes or if procedure is an option.   I will check cholesterol and other labs today.   Try tylenol if needed for aches and pains. Minimize alleve use if possible. Follow up if joint pain persists or new symptoms.  Return to the clinic or go to the nearest emergency room if any of your symptoms worsen or new symptoms occur.   If you have lab work done today you will be contacted with your lab results within the next 2 weeks.  If you have not heard from [place] then please contact us. The fastest way to get your results is to register for My Chart.   IF you received an x-ray today, you will receive an invoice from [facility]. Please contact [facility] at [phone] with questions or concerns regarding your invoice.   IF you received labwork today, you will receive an invoice from [facility]. Please contact [facility] at [phone] with questions or concerns regarding your invoice.   Our billing staff will not be able to assist you with questions regarding bills from these companies.  You will be contacted with the lab results as soon as they are available. The fastest way to get your results is to activate your My Chart account. Instructions are located on the last page of this paperwork. If you have not heard from [place] regarding the results in 2 weeks, please contact this office.

## 2020-05-03 NOTE — Progress Notes (Signed)
Subjective:  Patient ID: Gordon Ellis, male    DOB: 1945-06-26  Age: 75 y.o. MRN: [id number]  CC:  Chief Complaint  Patient presents with  . Transitions Of Care    Patient is fasting , request lab and general check up     HPI [name] presents for transition of care, previous primary care provider Dr. Pamella Pert.  History prostate cancer, followed by Dr. Rosana Hoes at [facility].  Last note reviewed from October 25, previous Gleason score 6 with 3+3 by history, subsequent follow-up biopsies were negative.  BPH with lower urinary tract symptoms with slight urge incontinence and nocturia.  Did not tolerate tamsulosin and finasteride due to nasal congestion.  Previously treated with sildenafil, then Cialis for erectile dysfunction, switch back to sildenafil due to side effects from Cialis.  Plan for more ergonomic bike due to some urinary issues and significant bicycling.  Option of tamsulosin, or Myrbetriq.  31-month follow-up planned.  Last PSA 4.47 on October 25, 3.66 in July 2021 4.88 in April 2021. Has new saddle and readjusted - seems to help urinary symptoms, still with nocturia 5 times at night. Not wanting to try meds at this time.   Bakers cyst behind left knee years ago. Better with biking.  alleve daily.  Occasional ankle swelling with flying. Resolves overnight. No CP/dyspnea. Elevates legs during day - improved.   Requests repeat thyroid testing today. No meds.  Lab Results  Component Value Date   TSH 1.050 04/12/2019    Hyperlipidemia: No current statin.  Does take fish oil daily.  Lab Results  Component Value Date   CHOL 198 04/12/2019   HDL 68 04/12/2019   LDLCALC 114 (H) 04/12/2019   TRIG 88 04/12/2019   CHOLHDL 2.9 04/12/2019   Lab Results  Component Value Date   ALT 21 04/12/2019   AST 24 04/12/2019   ALKPHOS 70 04/12/2019   BILITOT 0.5 04/12/2019   Hyperglycemia Borderline glucose of 103 on April 12, 2019 labs. Fasting  today.   Thrombocytopenia Borderline low platelets in 2020, but stable. Lab Results  Component Value Date   WBC 6.7 04/12/2019   HGB 14.4 04/12/2019   HCT 42.0 04/12/2019   MCV 92 04/12/2019   PLT 147 (L) 04/12/2019   Immunization History  Administered Date(s) Administered  . Influenza Whole 01/20/2013  . Influenza,inj,Quad PF,6+ Mos 03/21/2014, 02/27/2016, 01/09/2017  . Influenza-Unspecified 02/08/2020  . Pneumococcal Conjugate-13 05/05/2014  . Pneumococcal Polysaccharide-23 02/27/2016  . Tdap 12/22/2012  . Zoster 04/22/2013  . 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  High dose Flu vaccine in October.  Hybrid bike, biking [place], [address] [address]. Recently went [facility].   Dr. Wilhemina Bonito is dermatologist, ongoing care with hx of Basal cell CA.   Ectactic abdominal aorta - US in 02/2016. 2.8cm. repeat in 5 yrs: IMPRESSION: Maximum transverse diameter of the abdominal aorta is 2.8 cm proximally. Ectatic abdominal aorta at risk for aneurysm development. Recommend followup by ultrasound in 5 years. This recommendation follows ACR consensus guidelines: White Paper of the ACR Incidental Findings Committee II on Vascular Findings. J Am Coll Radiol 2013; 10:789-794. No air-fluid level or periaortic adenopathy.  History Patient Active Problem List   Diagnosis Date Noted  . Lower extremity edema 10/29/2017  . Left knee pain 10/01/2017  . 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  Nocturia 01/22/2016  . Urinary urgency 01/22/2016  . BPH (benign prostatic hyperplasia) 10/05/2014  . ED (erectile dysfunction) 10/05/2014  . Cataract extraction status 04/08/2013  . Prostate CA ([place]) 04/08/2013  . Basal cell carcinoma 04/16/2012   Past Medical History:  Diagnosis Date  . Cancer [facility])    prostate  . Cataract   . Sebaceous cyst    left  arm   Past Surgical History:  Procedure Laterality Date  . [place]   left  . EYE SURGERY    . HERNIA REPAIR  1994   RIH  . JOINT REPLACEMENT N/A    knee repair  . KNEE SURGERY  2010   right  . MOUTH SURGERY    . QUADRICEPS REPAIR Bilateral   . thumb surgery  2011   left  . VASECTOMY     No Known Allergies Prior to Admission medications   Medication Sig Start Date End Date Taking? Authorizing Provider  Cyanocobalamin (VITAMIN B 12 PO) Take 1 tablet by mouth daily.   Yes [provider]  Omega-3 Fatty Acids (FISH OIL PO) Take 1 tablet by mouth daily.   Yes [provider]  Thiamine HCl (VITAMIN B-1 PO) Take 1 tablet by mouth daily.   Yes [provider]  VITAMIN D PO Take 1 tablet by mouth daily.   Yes [provider]   Social History   Socioeconomic History  . Marital status: Married    Spouse name: Not on file  . Number of children: 2  . Years of education: Not on file  . Highest education level: Bachelor's degree (e.g., BA, AB, BS)  Occupational History  . Not on file  Tobacco Use  . Smoking status: Never Smoker  . Smokeless tobacco: Never Used  Vaping Use  . Vaping Use: Never used  Substance and Sexual Activity  . Alcohol use: Yes    Alcohol/week: 1.0 standard drink    Types: 1 Standard drinks or equivalent per week  . Drug use: No  . Sexual activity: Yes  Other Topics Concern  . Not on file  Social History Narrative   Retired, previously works for [employer] all over the country and [place] Strain: Not on [employer] Insecurity: Not on file  Transportation Needs: Not on file  Physical Activity: Not on file  Stress: Not on file  Social Connections: Not on file  Intimate Partner Violence: Not on file    Review of Systems Per HPI.   Objective:   Vitals:   05/03/20 0930 05/03/20 0942  BP: (!) 144/82 124/70  Pulse: 63   Temp: 97.6 F  (36.4 C)   SpO2: 97%   Weight: 223 lb (101.2 kg)   Height: 6\' 5"  (1.956 m)      Physical Exam Vitals reviewed.  Constitutional:      Appearance: He is well-developed and well-nourished.  HENT:     Head: Normocephalic and atraumatic.  Eyes:     Extraocular Movements: EOM normal.     Pupils: Pupils are equal, round, and reactive to light.  Neck:     Vascular: No carotid bruit or JVD.  Cardiovascular:     Rate and Rhythm: Normal rate and regular rhythm.     Heart sounds: Normal heart sounds. No murmur heard.   Pulmonary:     Effort: Pulmonary effort is normal.     Breath sounds: Normal breath sounds. No  rales.  Abdominal:     General: Abdomen is flat.     Palpations: Abdomen is soft. There is no mass (no pulsatile mass. ).  Musculoskeletal:        General: No edema.     Right lower leg: No edema.     Left lower leg: No edema.  Skin:    General: Skin is warm and dry.  Neurological:     Mental Status: He is alert and oriented to person, place, and time.  Psychiatric:        Mood and Affect: Mood and affect and mood normal.        Behavior: Behavior normal.     35 minutes spent during visit, greater than 50% counseling and assimilation of information, chart review, and discussion of plan.   Assessment & Plan:  CHRISTPHER LOEBER is a 75 y.o. male . Pure hypercholesterolemia - Plan: Lipid panel, Comprehensive metabolic panel  - check labs. No meds other than fish oil at this time.   Screening for diabetes mellitus - Plan: Hemoglobin A1c  -History of hyperglycemia.  Check A1c.  Continue exercise.  History of prostate cancer Benign prostatic hyperplasia with urinary frequency  -With nocturia and daytime urinary symptoms.  Continue follow-up with urology to decide on medications or other treatments.  Some improvement with change to more ergonomic saddle for bike.  Screening for thyroid disorder - Plan: TSH   Thrombocytopenia ([place]) - Plan: CBC  - borderline. Check CBC.    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  Not concerning on exam.  Asymptomatic.  Repeat ultrasound in November of this year.  Continue follow-up with specialists including dermatology, urology.  RTC precautions if persistent lower extremity edema or new arthralgias.  Tylenol recommended in place of Aleve if possible.  Potential side effects and risk of long-term NSAIDs discussed.  No orders of the defined types were placed in this encounter.  Patient Instructions   If you continue to have urinary issues, I would consider trying low dose med as recommended by Dr. Rosana Hoes or if procedure is an option.   I will check cholesterol and other labs today.   Try tylenol if needed for aches and pains. Minimize alleve use if possible. Follow up if joint pain persists or new symptoms.  Return to the clinic or go to the nearest emergency room if any of your symptoms worsen or new symptoms occur.   If you have lab work done today you will be contacted with your lab results within the next 2 weeks.  If you have not heard from [place] then please contact us. The fastest way to get your results is to register for My Chart.   IF you received an x-ray today, you will receive an invoice from [facility]. Please contact [facility] at [phone] with questions or concerns regarding your invoice.   IF you received labwork today, you will receive an invoice from [facility]. Please contact [facility] at [phone] with questions or concerns regarding your invoice.   Our billing staff will not be able to assist you with questions regarding bills from these companies.  You will be contacted with the lab results as soon as they are available. The fastest way to get your results is to activate your My Chart account. Instructions are located on the last page of this paperwork. If you have not heard from [place] regarding the  results in 2 weeks, please contact this office.          Signed, Merri Ray, MD [facility]

## 2020-05-04 ENCOUNTER — Telehealth: Payer: Self-pay | Admitting: Family Medicine

## 2020-05-04 LAB — COMPREHENSIVE METABOLIC PANEL
ALT: 22 IU/L (ref 0–44)
AST: 27 IU/L (ref 0–40)
Albumin/Globulin Ratio: 2.4 — ABNORMAL HIGH (ref 1.2–2.2)
Albumin: 4.5 g/dL (ref 3.7–4.7)
Alkaline Phosphatase: 67 IU/L (ref 44–121)
BUN/Creatinine Ratio: 14 (ref 10–24)
BUN: 9 mg/dL (ref 8–27)
Bilirubin Total: 0.4 mg/dL (ref 0.0–1.2)
CO2: 24 mmol/L (ref 20–29)
Calcium: 9.3 mg/dL (ref 8.6–10.2)
Chloride: 100 mmol/L (ref 96–106)
Creatinine, Ser: 0.65 mg/dL — ABNORMAL LOW (ref 0.76–1.27)
GFR calc Af Amer: 110 mL/min/{1.73_m2} (ref >59)
GFR calc non Af Amer: 95 mL/min/{1.73_m2} (ref >59)
Globulin, Total: 1.9 g/dL (ref 1.5–4.5)
Glucose: 103 mg/dL — ABNORMAL HIGH (ref 65–99)
Potassium: 4.8 mmol/L (ref 3.5–5.2)
Sodium: 136 mmol/L (ref 134–144)
Total Protein: 6.4 g/dL (ref 6.0–8.5)

## 2020-05-04 LAB — CBC
Hematocrit: 44.6 % (ref 37.5–51.0)
Hemoglobin: 14.9 g/dL (ref 13.0–17.7)
MCH: 31.4 pg (ref 26.6–33.0)
MCHC: 33.4 g/dL (ref 31.5–35.7)
MCV: 94 fL (ref 79–97)
Platelets: 162 10*3/uL (ref 150–450)
RBC: 4.75 x10E6/uL (ref 4.14–5.80)
RDW: 12.5 % (ref 11.6–15.4)
WBC: 8.1 10*3/uL (ref 3.4–10.8)

## 2020-05-04 LAB — LIPID PANEL
Chol/HDL Ratio: 2.9 ratio (ref 0.0–5.0)
Cholesterol, Total: 217 mg/dL — ABNORMAL HIGH (ref 100–199)
HDL: 76 mg/dL (ref >39)
LDL Chol Calc (NIH): 128 mg/dL — ABNORMAL HIGH (ref 0–99)
Triglycerides: 75 mg/dL (ref 0–149)
VLDL Cholesterol Cal: 13 mg/dL (ref 5–40)

## 2020-05-04 LAB — TSH: TSH: 1.18 u[IU]/mL (ref 0.450–4.500)

## 2020-05-04 LAB — HEMOGLOBIN A1C
Est. average glucose Bld gHb Est-mCnc: 117 mg/dL
Hgb A1c MFr Bld: 5.7 % — ABNORMAL HIGH (ref 4.8–5.6)

## 2020-05-04 NOTE — Telephone Encounter (Signed)
LVM informing pt

## 2020-05-04 NOTE — Telephone Encounter (Signed)
05/04/2020 - PATIENT SAW DR. Carlota Raspberry ON Wednesday 05/03/2020. HE WANTS TO BE SURE DR. GREENE CODED HIS VISIT AS AN ANNUAL PHYSICAL. HE SAID THERE IN NO QUESTION THAT A PHYSICAL WAS DONE. THEN HE WOULD LIKE TO GET HIS $10.00 CO-PAY BACK THAT HE PAID. HE WOULD LIKE TO BE CALLED AS SOON AS POSSIBLE ABOUT THIS. BEST PHONE [phone] (HOME) [place]

## 2020-05-04 NOTE — Telephone Encounter (Signed)
This was a TOC not a physical. However Ansel Bong will refund the 10 collected through phresha app

## 2020-05-10 ENCOUNTER — Telehealth: Payer: Self-pay | Admitting: Family Medicine

## 2020-05-10 NOTE — Telephone Encounter (Signed)
Pt called and stated that he would like his Lab results from his [facility] appt mailed to his house. Please advise.

## 2020-05-10 NOTE — Telephone Encounter (Signed)
Labs were sent to pt home

## 2020-05-15 ENCOUNTER — Encounter: Payer: Self-pay | Admitting: Radiology
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
## 2020-10-24 ENCOUNTER — Other Ambulatory Visit: Payer: Self-pay

## 2020-10-24 ENCOUNTER — Encounter (HOSPITAL_COMMUNITY): Payer: Self-pay | Admitting: Orthopedic Surgery

## 2020-10-24 DIAGNOSIS — M898X1 Other specified disorders of bone, shoulder: Secondary | ICD-10-CM | POA: Insufficient documentation

## 2020-10-24 DIAGNOSIS — R0781 Pleurodynia: Secondary | ICD-10-CM | POA: Insufficient documentation

## 2020-10-24 DIAGNOSIS — M25552 Pain in left hip: Secondary | ICD-10-CM | POA: Insufficient documentation

## 2020-10-24 DIAGNOSIS — R0789 Other chest pain: Secondary | ICD-10-CM | POA: Insufficient documentation

## 2020-10-24 NOTE — Progress Notes (Addendum)
Gordon Ellis denies chest pain or shortness of breath. Patient denies any s/s of Covid in her home and is unaware of any exposures.   I spoke to Dr. Therisa Doyne and asked about eating after midnight - surgery is scheduled for 1905. Dr Ola Spurr said that Gordon Ellis may have a full breakfast until 0900, nothing to eat after 0900. Patient may have clear liquids until 1500. I called Gordon Ellis and informed him of the new diet orders, patient voiced understanding.

## 2020-10-25 ENCOUNTER — Ambulatory Visit (HOSPITAL_COMMUNITY): Payer: Medicare Other | Admitting: Anesthesiology

## 2020-10-25 ENCOUNTER — Ambulatory Visit (HOSPITAL_COMMUNITY)
Admission: RE | Admit: 2020-10-25 | Discharge: 2020-10-25 | Disposition: A | Discharge status: Home / Self Care | Payer: Medicare Other | Attending: Orthopedic Surgery | Admitting: Orthopedic Surgery

## 2020-10-25 ENCOUNTER — Encounter (HOSPITAL_COMMUNITY)
Admission: RE | Disposition: A | Discharge status: Home / Self Care | Payer: Self-pay | Source: Home / Self Care | Attending: Orthopedic Surgery

## 2020-10-25 ENCOUNTER — Encounter (HOSPITAL_COMMUNITY): Payer: Self-pay | Admitting: Orthopedic Surgery

## 2020-10-25 ENCOUNTER — Ambulatory Visit (HOSPITAL_COMMUNITY): Payer: Medicare Other

## 2020-10-25 DIAGNOSIS — S42022A Displaced fracture of shaft of left clavicle, initial encounter for closed fracture: Secondary | ICD-10-CM | POA: Insufficient documentation

## 2020-10-25 DIAGNOSIS — Z419 Encounter for procedure for purposes other than remedying health state, unspecified: Secondary | ICD-10-CM

## 2020-10-25 HISTORY — PX: ORIF CLAVICULAR FRACTURE: SHX5055

## 2020-10-25 SURGERY — OPEN REDUCTION INTERNAL FIXATION (ORIF) CLAVICULAR FRACTURE
Anesthesia: General | Site: Shoulder | Laterality: Left

## 2020-10-25 MED ORDER — ACETAMINOPHEN 500 MG PO TABS
1000.0000 mg | ORAL_TABLET | Freq: Once | ORAL | Status: AC
  Administered 2020-10-25: 1000 mg via ORAL
  Filled 2020-10-25: qty 2

## 2020-10-25 MED ORDER — DEXAMETHASONE SODIUM PHOSPHATE 10 MG/ML IJ SOLN
INTRAMUSCULAR | Status: AC
  Filled 2020-10-25: qty 1

## 2020-10-25 MED ORDER — FENTANYL CITRATE (PF) 100 MCG/2ML IJ SOLN
INTRAMUSCULAR | Status: AC
  Administered 2020-10-25: 100 ug via INTRAVENOUS
  Filled 2020-10-25: qty 2

## 2020-10-25 MED ORDER — PHENYLEPHRINE HCL-NACL 10-0.9 MG/250ML-% IV SOLN
INTRAVENOUS | Status: DC | PRN
  Administered 2020-10-25: 40 ug/min via INTRAVENOUS

## 2020-10-25 MED ORDER — PHENYLEPHRINE 40 MCG/ML (10ML) SYRINGE FOR IV PUSH (FOR BLOOD PRESSURE SUPPORT)
PREFILLED_SYRINGE | INTRAVENOUS | Status: DC | PRN
Start: 2020-10-25 — End: 2020-10-26
  Administered 2020-10-25 (×2): 80 ug via INTRAVENOUS

## 2020-10-25 MED ORDER — LACTATED RINGERS IV SOLN: INTRAVENOUS | Status: DC

## 2020-10-25 MED ORDER — FENTANYL CITRATE (PF) 100 MCG/2ML IJ SOLN: 25.0000 ug | INTRAMUSCULAR | Status: DC | PRN

## 2020-10-25 MED ORDER — FENTANYL CITRATE (PF) 250 MCG/5ML IJ SOLN
INTRAMUSCULAR | Status: AC
  Filled 2020-10-25: qty 5

## 2020-10-25 MED ORDER — ONDANSETRON HCL 4 MG/2ML IJ SOLN
INTRAMUSCULAR | Status: AC
  Filled 2020-10-25: qty 2

## 2020-10-25 MED ORDER — MIDAZOLAM HCL 2 MG/2ML IJ SOLN: 2.0000 mg | Freq: Once | INTRAMUSCULAR | Status: AC

## 2020-10-25 MED ORDER — BUPIVACAINE-EPINEPHRINE (PF) 0.25% -1:200000 IJ SOLN
INTRAMUSCULAR | Status: AC
  Filled 2020-10-25: qty 30

## 2020-10-25 MED ORDER — BUPIVACAINE-EPINEPHRINE (PF) 0.5% -1:200000 IJ SOLN
INTRAMUSCULAR | Status: DC | PRN
  Administered 2020-10-25: 30 mL via PERINEURAL

## 2020-10-25 MED ORDER — DEXAMETHASONE SODIUM PHOSPHATE 10 MG/ML IJ SOLN
INTRAMUSCULAR | Status: DC | PRN
  Administered 2020-10-25: 5 mg via INTRAVENOUS

## 2020-10-25 MED ORDER — PROPOFOL 10 MG/ML IV BOLUS
INTRAVENOUS | Status: AC
  Filled 2020-10-25: qty 20

## 2020-10-25 MED ORDER — FENTANYL CITRATE (PF) 100 MCG/2ML IJ SOLN: 100.0000 ug | Freq: Once | INTRAMUSCULAR | Status: AC

## 2020-10-25 MED ORDER — MIDAZOLAM HCL 2 MG/2ML IJ SOLN
INTRAMUSCULAR | Status: AC
  Administered 2020-10-25: 2 mg via INTRAVENOUS
  Filled 2020-10-25: qty 2

## 2020-10-25 MED ORDER — CEFAZOLIN SODIUM-DEXTROSE 2-4 GM/100ML-% IV SOLN
2.0000 g | INTRAVENOUS | Status: AC
  Administered 2020-10-25: 2 g via INTRAVENOUS
  Filled 2020-10-25: qty 100

## 2020-10-25 MED ORDER — ONDANSETRON HCL 4 MG/2ML IJ SOLN
INTRAMUSCULAR | Status: DC | PRN
  Administered 2020-10-25: 4 mg via INTRAVENOUS

## 2020-10-25 MED ORDER — CHLORHEXIDINE GLUCONATE 0.12 % MT SOLN
15.0000 mL | OROMUCOSAL | Status: AC
  Filled 2020-10-25: qty 15

## 2020-10-25 MED ORDER — SUGAMMADEX SODIUM 200 MG/2ML IV SOLN
INTRAVENOUS | Status: DC | PRN
  Administered 2020-10-25: 200 mg via INTRAVENOUS

## 2020-10-25 MED ORDER — PROPOFOL 10 MG/ML IV BOLUS
INTRAVENOUS | Status: DC | PRN
  Administered 2020-10-25: 150 mg via INTRAVENOUS

## 2020-10-25 MED ORDER — BUPIVACAINE-EPINEPHRINE 0.25% -1:200000 IJ SOLN
INTRAMUSCULAR | Status: DC | PRN
  Administered 2020-10-25: 28 mL

## 2020-10-25 MED ORDER — CHLORHEXIDINE GLUCONATE 0.12 % MT SOLN
OROMUCOSAL | Status: AC
  Administered 2020-10-25: 15 mL via OROMUCOSAL
  Filled 2020-10-25: qty 15

## 2020-10-25 MED ORDER — LIDOCAINE 2% (20 MG/ML) 5 ML SYRINGE
INTRAMUSCULAR | Status: DC | PRN
  Administered 2020-10-25: 40 mg via INTRAVENOUS

## 2020-10-25 MED ORDER — ALBUMIN HUMAN 5 % IV SOLN: INTRAVENOUS | Status: DC | PRN

## 2020-10-25 MED ORDER — ROCURONIUM BROMIDE 10 MG/ML (PF) SYRINGE
PREFILLED_SYRINGE | INTRAVENOUS | Status: DC | PRN
  Administered 2020-10-25: 50 mg via INTRAVENOUS

## 2020-10-25 MED ORDER — 0.9 % SODIUM CHLORIDE (POUR BTL) OPTIME
TOPICAL | Status: DC | PRN
  Administered 2020-10-25: 1000 mL

## 2020-10-25 SURGICAL SUPPLY — 56 items
BAG COUNTER SPONGE SURGICOUNT (BAG) ×2 IMPLANT
BAG SPNG CNTER NS LX DISP (BAG) ×1
BIT DRILL CLAV ALPS 2.7X145 (BIT) ×1 IMPLANT
BIT DRILL Q COUPLING 4.5 (BIT) IMPLANT
BIT DRILL Q/COUPLING 1 (BIT) IMPLANT
CLEANER TIP ELECTROSURG 2X2 (MISCELLANEOUS) ×2 IMPLANT
DRAPE C-ARM 42X72 X-RAY (DRAPES) ×2 IMPLANT
DRAPE IMP U-DRAPE 54X76 (DRAPES) ×2 IMPLANT
DRAPE INCISE IOBAN 66X45 STRL (DRAPES) ×2 IMPLANT
DRAPE ORTHO SPLIT 77X108 STRL (DRAPES) ×4
DRAPE SURG ORHT 6 SPLT 77X108 (DRAPES) ×2 IMPLANT
DRAPE U-SHAPE 47X51 STRL (DRAPES) ×2 IMPLANT
DRSG EMULSION OIL 3X3 NADH (GAUZE/BANDAGES/DRESSINGS) ×2 IMPLANT
DURAPREP 26ML APPLICATOR (WOUND CARE) ×2 IMPLANT
ELECT NDL TIP 2.8 STRL (NEEDLE) ×1 IMPLANT
ELECT NEEDLE TIP 2.8 STRL (NEEDLE) ×2 IMPLANT
ELECT REM PT RETURN 9FT ADLT (ELECTROSURGICAL) ×2
ELECTRODE REM PT RTRN 9FT ADLT (ELECTROSURGICAL) ×1 IMPLANT
GAUZE SPONGE 4X4 12PLY STRL (GAUZE/BANDAGES/DRESSINGS) ×2 IMPLANT
GAUZE SPONGE 4X4 16PLY XRAY LF (GAUZE/BANDAGES/DRESSINGS) ×1 IMPLANT
GLOVE SURG ORTHO LTX SZ7.5 (GLOVE) ×2 IMPLANT
GLOVE SURG ORTHO LTX SZ8.5 (GLOVE) ×2 IMPLANT
GLOVE SURG POLY ORTHO LF SZ7.5 (GLOVE) ×2 IMPLANT
GLOVE SURG POLY ORTHO LF SZ8 (GLOVE) ×2 IMPLANT
GOWN STRL REUS W/ TWL LRG LVL3 (GOWN DISPOSABLE) ×2 IMPLANT
GOWN STRL REUS W/ TWL XL LVL3 (GOWN DISPOSABLE) ×2 IMPLANT
GOWN STRL REUS W/TWL LRG LVL3 (GOWN DISPOSABLE) ×4
GOWN STRL REUS W/TWL XL LVL3 (GOWN DISPOSABLE) ×4
KIT BASIN OR (CUSTOM PROCEDURE TRAY) ×2 IMPLANT
KIT TURNOVER KIT B (KITS) ×2 IMPLANT
MANIFOLD NEPTUNE II (INSTRUMENTS) ×2 IMPLANT
NDL HYPO 25GX1X1/2 BEV (NEEDLE) ×1 IMPLANT
NEEDLE HYPO 25GX1X1/2 BEV (NEEDLE) ×2 IMPLANT
NS IRRIG 1000ML POUR BTL (IV SOLUTION) ×2 IMPLANT
PACK SHOULDER (CUSTOM PROCEDURE TRAY) ×2 IMPLANT
PACK UNIVERSAL I (CUSTOM PROCEDURE TRAY) ×2 IMPLANT
PAD ARMBOARD 7.5X6 YLW CONV (MISCELLANEOUS) ×4 IMPLANT
PLATE CLAV SUP LT 10H 110MM NS (Plate) ×1 IMPLANT
SCREW CORT LP 3.5X12 (Screw) ×3 IMPLANT
SCREW CORT LP 3.5X14 (Screw) ×2 IMPLANT
SCREW LOCK CORT STAR 3.5X12 (Screw) ×2 IMPLANT
SCREW LOCK CORT STAR 3.5X14 (Screw) ×1 IMPLANT
SLING ARM FOAM STRAP LRG (SOFTGOODS) ×2 IMPLANT
SLING ARM FOAM STRAP XLG (SOFTGOODS) ×1 IMPLANT
SPONGE T-LAP 4X18 ~~LOC~~+RFID (SPONGE) ×4 IMPLANT
STRIP CLOSURE SKIN 1/2X4 (GAUZE/BANDAGES/DRESSINGS) ×4 IMPLANT
SUCTION FRAZIER HANDLE 10FR (MISCELLANEOUS) ×2
SUCTION TUBE FRAZIER 10FR DISP (MISCELLANEOUS) ×1 IMPLANT
SUT MNCRL AB 4-0 PS2 18 (SUTURE) ×2 IMPLANT
SUT VIC AB 2-0 CT1 27 (SUTURE) ×2
SUT VIC AB 2-0 CT1 TAPERPNT 27 (SUTURE) ×1 IMPLANT
SYR CONTROL 10ML LL (SYRINGE) ×2 IMPLANT
TAPE STRIPS DRAPE STRL (GAUZE/BANDAGES/DRESSINGS) ×1 IMPLANT
TOWEL GREEN STERILE (TOWEL DISPOSABLE) ×2 IMPLANT
TOWEL GREEN STERILE FF (TOWEL DISPOSABLE) ×2 IMPLANT
WATER STERILE IRR 1000ML POUR (IV SOLUTION) ×2 IMPLANT

## 2020-10-25 NOTE — Anesthesia Procedure Notes (Signed)
Anesthesia Regional Block: Interscalene brachial plexus block   Pre-Anesthetic Checklist: , timeout performed,  Correct Patient, Correct Site, Correct Laterality,  Correct Procedure, Correct Position, site marked,  Risks and benefits discussed,  Surgical consent,  Pre-op evaluation,  At surgeon's request and post-op pain management  Laterality: Left and Upper  Prep: chloraprep       Needles:  Injection technique: Single-shot  Needle Type: Echogenic Needle     Needle Length: 9cm  Needle Gauge: 21     Additional Needles:   Procedures:,,,, ultrasound used (permanent image in chart),,    Narrative:  Start time: 10/25/2020 5:51 PM End time: 10/25/2020 5:57 PM Injection made incrementally with aspirations every 5 mL.  Performed by: Personally  Anesthesiologist: Annye Asa, MD  Additional Notes: Pt identified in Holding room.  Monitors applied. Working IV access confirmed. Sterile prep L clavicle and neck.  #21ga ECHOgenic Arrow block needle to interscalene brachial plexus with US guidance.  30cc 0.5% Bupivacaine with 1:200k epi injected incrementally after negative test dose.  Patient asymptomatic, VSS, no heme aspirated, tolerated well.   Jenita Seashore, MD

## 2020-10-25 NOTE — Discharge Instructions (Signed)
Ice to the shoulder constantly.  Keep the incision covered and clean and dry through the weekend then ok to get it wet in the shower on Monday.   Wear your sling when up and around, may remove and support with pillows in the home while awake.  Keep pillow propped behind the operative elbow.  Call Dr Veverly Fells at [phone] (cell )for any questions If necessary call the office if unable to contact Dr Veverly Fells [phone] (office)  Follow up with Dr Veverly Fells in two weeks in the office, call [phone] for appt

## 2020-10-25 NOTE — Interval H&P Note (Signed)
History and Physical Interval Note:  10/25/2020 6:48 PM  Gordon Ellis  has presented today for surgery, with the diagnosis of clavicle fracture.  The various methods of treatment have been discussed with the patient and family. After consideration of risks, benefits and other options for treatment, the patient has consented to  Procedure(s): OPEN REDUCTION INTERNAL FIXATION (ORIF) CLAVICULAR FRACTURE (Left) as a surgical intervention.  The patient's history has been reviewed, patient examined, no change in status, stable for surgery.  I have reviewed the patient's chart and labs.  Questions were answered to the patient's satisfaction.     Augustin Schooling
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
## 2020-10-25 NOTE — Anesthesia Procedure Notes (Signed)
Procedure Name: Intubation Date/Time: 10/25/2020 6:57 PM Performed by: Jakhai Fant T, CRNA Pre-anesthesia Checklist: Patient identified, Emergency Drugs available, Suction available and Patient being monitored Patient Re-evaluated:Patient Re-evaluated prior to induction Oxygen Delivery Method: Circle system utilized Preoxygenation: Pre-oxygenation with 100% oxygen Induction Type: IV induction Ventilation: Mask ventilation without difficulty Laryngoscope Size: Miller and 3 Grade View: Grade II Tube type: Oral Tube size: 7.5 mm Number of attempts: 1 Airway Equipment and Method: Stylet and Oral airway Placement Confirmation: ETT inserted through vocal cords under direct vision, positive ETCO2 and breath sounds checked- equal and bilateral Secured at: 22 cm Tube secured with: Tape Dental Injury: Teeth and Oropharynx as per pre-operative assessment

## 2020-10-25 NOTE — Brief Op Note (Signed)
10/25/2020  8:23 PM  PATIENT:  Gordon Ellis  75 y.o. male  PRE-OPERATIVE DIAGNOSIS:  clavicle fracture, displaced and comminuted  POST-OPERATIVE DIAGNOSIS:  clavicle fracture, displaced and comminuted  PROCEDURE:  Procedure(s): OPEN REDUCTION INTERNAL FIXATION (ORIF) CLAVICULAR FRACTURE (Left) Biomet clavicle plate  SURGEON:  Surgeon(s) and Role:    Netta Cedars, MD - Primary  PHYSICIAN ASSISTANT:   ASSISTANTS: Ventura Bruns, PA-C   ANESTHESIA:   local, regional, and general  EBL:  100 mL   BLOOD ADMINISTERED:none  DRAINS: none   LOCAL MEDICATIONS USED:  MARCAINE     SPECIMEN:  No Specimen  DISPOSITION OF SPECIMEN:  N/A  COUNTS:  YES  TOURNIQUET:  * No tourniquets in log *  DICTATION: .Other Dictation: Dictation Number [id number]  PLAN OF CARE: Discharge to home after PACU  PATIENT DISPOSITION:  PACU - hemodynamically stable.   Delay start of Pharmacological VTE agent (>24hrs) due to surgical blood loss or risk of bleeding: not applicable
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
## 2020-10-25 NOTE — Transfer of Care (Signed)
Immediate Anesthesia Transfer of Care Note  Patient: Gordon Ellis  Procedure(s) Performed: OPEN REDUCTION INTERNAL FIXATION (ORIF) CLAVICULAR FRACTURE (Left: Shoulder)  Patient Location: PACU  Anesthesia Type:GA combined with regional for post-op pain  Level of Consciousness: awake, alert  and oriented  Airway & Oxygen Therapy: Patient Spontanous Breathing and Patient connected to nasal cannula oxygen  Post-op Assessment: Report given to RN, Post -op Vital signs reviewed and stable and Patient moving all extremities  Post vital signs: Reviewed and stable  Last Vitals:  Vitals Value Taken Time  BP 129/78 10/25/20 2037  Temp    Pulse 76 10/25/20 2039  Resp 15 10/25/20 2039  SpO2 99 % 10/25/20 2039  Vitals shown include unvalidated device data.  Last Pain:  Vitals:   10/25/20 1800  TempSrc:   PainSc: 0-No pain      Patients Stated Pain Goal: 0 (16/57/90 3833)  Complications: No notable events documented.

## 2020-10-26 ENCOUNTER — Encounter (HOSPITAL_COMMUNITY): Payer: Self-pay | Admitting: Orthopedic Surgery
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
## 2020-10-26 NOTE — Anesthesia Postprocedure Evaluation (Signed)
Anesthesia Post Note  Patient: DERALD LORGE  Procedure(s) Performed: OPEN REDUCTION INTERNAL FIXATION (ORIF) CLAVICULAR FRACTURE (Left: Shoulder)     Patient location during evaluation: PACU Anesthesia Type: General and Regional Level of consciousness: awake and alert Pain management: pain level controlled Vital Signs Assessment: post-procedure vital signs reviewed and stable Respiratory status: spontaneous breathing, nonlabored ventilation, respiratory function stable and patient connected to nasal cannula oxygen Cardiovascular status: blood pressure returned to baseline and stable Postop Assessment: no apparent nausea or vomiting Anesthetic complications: no   No notable events documented.  Last Vitals:  Vitals:   10/25/20 2100 10/25/20 2115  BP: 133/79 131/78  Pulse: 77 80  Resp: 17 (!) 22  Temp:  36.6 C  SpO2: 95% 95%    Last Pain:  Vitals:   10/25/20 2115  TempSrc:   PainSc: 0-No pain                 Ludell Zacarias

## 2021-05-03 ENCOUNTER — Ambulatory Visit (HOSPITAL_COMMUNITY)
Admission: RE | Admit: 2021-05-03 | Discharge: 2021-05-03 | Disposition: A | Discharge status: Home / Self Care | Payer: Medicare Other | Source: Ambulatory Visit | Attending: Cardiology | Admitting: Cardiology

## 2021-05-03 ENCOUNTER — Other Ambulatory Visit: Payer: Self-pay

## 2021-05-03 DIAGNOSIS — I77811 Abdominal aortic ectasia: Secondary | ICD-10-CM | POA: Diagnosis present

## 2021-05-09 ENCOUNTER — Encounter: Payer: Self-pay | Admitting: Family Medicine

## 2021-05-09 ENCOUNTER — Ambulatory Visit (INDEPENDENT_AMBULATORY_CARE_PROVIDER_SITE_OTHER): Payer: Medicare Other | Admitting: Family Medicine

## 2021-05-09 VITALS — BP 128/74 | HR 108 | Temp 98.0°F | Resp 17 | Ht 77.0 in | Wt 222.8 lb

## 2021-05-09 DIAGNOSIS — R7303 Prediabetes: Secondary | ICD-10-CM

## 2021-05-09 DIAGNOSIS — Z131 Encounter for screening for diabetes mellitus: Secondary | ICD-10-CM | POA: Diagnosis not present

## 2021-05-09 DIAGNOSIS — Z130 Encounter for screening for diseases of the blood and blood-forming organs and certain disorders involving the immune mechanism: Secondary | ICD-10-CM

## 2021-05-09 DIAGNOSIS — Z0000 Encounter for general adult medical examination without abnormal findings: Secondary | ICD-10-CM | POA: Diagnosis not present

## 2021-05-09 DIAGNOSIS — E7800 Pure hypercholesterolemia, unspecified: Secondary | ICD-10-CM

## 2021-05-09 LAB — CBC WITH DIFFERENTIAL/PLATELET
Basophils Absolute: 0 10*3/uL (ref 0.0–0.1)
Basophils Relative: 0.5 % (ref 0.0–3.0)
Eosinophils Absolute: 0.1 10*3/uL (ref 0.0–0.7)
Eosinophils Relative: 1.2 % (ref 0.0–5.0)
HCT: 42.7 % (ref 39.0–52.0)
Hemoglobin: 14.3 g/dL (ref 13.0–17.0)
Lymphocytes Relative: 49.9 % — ABNORMAL HIGH (ref 12.0–46.0)
Lymphs Abs: 3 10*3/uL (ref 0.7–4.0)
MCHC: 33.4 g/dL (ref 30.0–36.0)
MCV: 93.9 fl (ref 78.0–100.0)
Monocytes Absolute: 0.5 10*3/uL (ref 0.1–1.0)
Monocytes Relative: 9.2 % (ref 3.0–12.0)
Neutro Abs: 2.3 10*3/uL (ref 1.4–7.7)
Neutrophils Relative %: 39.2 % — ABNORMAL LOW (ref 43.0–77.0)
Platelets: 161 10*3/uL (ref 150.0–400.0)
RBC: 4.55 Mil/uL (ref 4.22–5.81)
RDW: 13.2 % (ref 11.5–15.5)
WBC: 5.9 10*3/uL (ref 4.0–10.5)

## 2021-05-09 LAB — COMPREHENSIVE METABOLIC PANEL
ALT: 19 U/L (ref 0–53)
AST: 24 U/L (ref 0–37)
Albumin: 4.5 g/dL (ref 3.5–5.2)
Alkaline Phosphatase: 62 U/L (ref 39–117)
BUN: 11 mg/dL (ref 6–23)
CO2: 28 mEq/L (ref 19–32)
Calcium: 9 mg/dL (ref 8.4–10.5)
Chloride: 101 mEq/L (ref 96–112)
Creatinine, Ser: 0.63 mg/dL (ref 0.40–1.50)
GFR: 92.67 mL/min (ref >60.00)
Glucose, Bld: 84 mg/dL (ref 70–99)
Potassium: 4.2 mEq/L (ref 3.5–5.1)
Sodium: 135 mEq/L (ref 135–145)
Total Bilirubin: 0.6 mg/dL (ref 0.2–1.2)
Total Protein: 6.5 g/dL (ref 6.0–8.3)

## 2021-05-09 LAB — LIPID PANEL
Cholesterol: 197 mg/dL (ref 0–200)
HDL: 74.4 mg/dL (ref >39.00)
LDL Cholesterol: 109 mg/dL — ABNORMAL HIGH (ref 0–99)
NonHDL: 122.72
Total CHOL/HDL Ratio: 3
Triglycerides: 68 mg/dL (ref 0.0–149.0)
VLDL: 13.6 mg/dL (ref 0.0–40.0)

## 2021-05-09 LAB — HEMOGLOBIN A1C: Hgb A1c MFr Bld: 6 % (ref 4.6–6.5)

## 2021-05-09 NOTE — Patient Instructions (Addendum)
Check with your insurance about type of colon cancer testing performed and when. That will determine repeat testing and timing if needed. If any concerns on labs I will let you know.  Thanks for coming in today.   Preventive Care 41 Years and Older, Male Preventive care refers to lifestyle choices and visits with your health care provider that can promote health and wellness. Preventive care visits are also called wellness exams. What can I expect for my preventive care visit? Counseling During your preventive care visit, your health care provider may ask about your: Medical history, including: Past medical problems. Family medical history. History of falls. Current health, including: Emotional well-being. Home life and relationship well-being. Sexual activity. Memory and ability to understand (cognition). Lifestyle, including: Alcohol, nicotine or tobacco, and drug use. Access to firearms. Diet, exercise, and sleep habits. Work and work Statistician. Sunscreen use. Safety issues such as seatbelt and bike helmet use. Physical exam Your health care provider will check your: Height and weight. These may be used to calculate your BMI (body mass index). BMI is a measurement that tells if you are at a healthy weight. Waist circumference. This measures the distance around your waistline. This measurement also tells if you are at a healthy weight and may help predict your risk of certain diseases, such as type 2 diabetes and high blood pressure. Heart rate and blood pressure. Body temperature. Skin for abnormal spots. What immunizations do I need? Vaccines are usually given at various ages, according to a schedule. Your health care provider will recommend vaccines for you based on your age, medical history, and lifestyle or other factors, such as travel or where you work. What tests do I need? Screening Your health care provider may recommend screening tests for certain conditions. This  may include: Lipid and cholesterol levels. Diabetes screening. This is done by checking your blood sugar (glucose) after you have not eaten for a while (fasting). Hepatitis C test. Hepatitis B test. HIV (human immunodeficiency virus) test. STI (sexually transmitted infection) testing, if you are at risk. Lung cancer screening. Colorectal cancer screening. Prostate cancer screening. Abdominal aortic aneurysm (AAA) screening. You may need this if you are a current or former smoker. Talk with your health care provider about your test results, treatment options, and if necessary, the need for more tests. Follow these instructions at home: Eating and drinking  Eat a diet that includes fresh fruits and vegetables, whole grains, lean protein, and low-fat dairy products. Limit your intake of foods with high amounts of sugar, saturated fats, and salt. Take vitamin and mineral supplements as recommended by your health care provider. Do not drink alcohol if your health care provider tells you not to drink. If you drink alcohol: Limit how much you have to 0-2 drinks a day. Know how much alcohol is in your drink. In the [place] one drink equals one 12 oz bottle of beer (355 mL), one 5 oz glass of wine (148 mL), or one 1 oz glass of hard liquor (44 mL). Lifestyle Brush your teeth every morning and night with fluoride toothpaste. Floss one time each day. Exercise for at least 30 minutes 5 or more days each week. Do not use any products that contain nicotine or tobacco. These products include cigarettes, chewing tobacco, and vaping devices, such as e-cigarettes. If you need help quitting, ask your health care provider. Do not use drugs. If you are sexually active, practice safe sex. Use a condom or other form of protection to  prevent STIs. Take aspirin only as told by your health care provider. Make sure that you understand how much to take and what form to take. Work with your health care provider to find  out whether it is safe and beneficial for you to take aspirin daily. Ask your health care provider if you need to take a cholesterol-lowering medicine (statin). Find healthy ways to manage stress, such as: Meditation, yoga, or listening to music. Journaling. Talking to a trusted person. Spending time with friends and family. Safety Always wear your seat belt while driving or riding in a vehicle. Do not drive: If you have been drinking alcohol. Do not ride with someone who has been drinking. When you are tired or distracted. While texting. If you have been using any mind-altering substances or drugs. Wear a helmet and other protective equipment during sports activities. If you have firearms in your house, make sure you follow all gun safety procedures. Minimize exposure to UV radiation to reduce your risk of skin cancer. What's next? Visit your health care provider once a year for an annual wellness visit. Ask your health care provider how often you should have your eyes and teeth checked. Stay up to date on all vaccines. This information is not intended to replace advice given to you by your health care provider. Make sure you discuss any questions you have with your health care provider. Document Revised: 10/04/2020 Document Reviewed: 10/04/2020 Elsevier Patient Education  [place].
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
## 2021-05-18 ENCOUNTER — Telehealth: Payer: Self-pay | Admitting: Family Medicine

## 2021-05-18 NOTE — Telephone Encounter (Signed)
Pt would like a phone call about lab results and a copy mailed to him. Last appt was 05/09/21

## 2021-05-18 NOTE — Telephone Encounter (Signed)
Called and spoke to pt, no concerns letter sent

## 2022-05-10 ENCOUNTER — Encounter: Payer: Self-pay | Admitting: Family Medicine

## 2022-05-10 ENCOUNTER — Ambulatory Visit (INDEPENDENT_AMBULATORY_CARE_PROVIDER_SITE_OTHER): Payer: Medicare Other | Admitting: Family Medicine

## 2022-05-10 VITALS — BP 128/76 | HR 61 | Temp 98.0°F | Ht 75.0 in | Wt 203.8 lb

## 2022-05-10 DIAGNOSIS — Z7185 Encounter for immunization safety counseling: Secondary | ICD-10-CM

## 2022-05-10 DIAGNOSIS — R7303 Prediabetes: Secondary | ICD-10-CM | POA: Diagnosis not present

## 2022-05-10 DIAGNOSIS — Z862 Personal history of diseases of the blood and blood-forming organs and certain disorders involving the immune mechanism: Secondary | ICD-10-CM

## 2022-05-10 DIAGNOSIS — N529 Male erectile dysfunction, unspecified: Secondary | ICD-10-CM

## 2022-05-10 DIAGNOSIS — R351 Nocturia: Secondary | ICD-10-CM

## 2022-05-10 DIAGNOSIS — N401 Enlarged prostate with lower urinary tract symptoms: Secondary | ICD-10-CM

## 2022-05-10 DIAGNOSIS — G5712 Meralgia paresthetica, left lower limb: Secondary | ICD-10-CM

## 2022-05-10 DIAGNOSIS — E7800 Pure hypercholesterolemia, unspecified: Secondary | ICD-10-CM | POA: Diagnosis not present

## 2022-05-10 DIAGNOSIS — M7062 Trochanteric bursitis, left hip: Secondary | ICD-10-CM

## 2022-05-10 LAB — COMPREHENSIVE METABOLIC PANEL
ALT: 18 U/L (ref 0–53)
AST: 23 U/L (ref 0–37)
Albumin: 4.4 g/dL (ref 3.5–5.2)
Alkaline Phosphatase: 67 U/L (ref 39–117)
BUN: 12 mg/dL (ref 6–23)
CO2: 29 mEq/L (ref 19–32)
Calcium: 9.3 mg/dL (ref 8.4–10.5)
Chloride: 100 mEq/L (ref 96–112)
Creatinine, Ser: 0.62 mg/dL (ref 0.40–1.50)
GFR: 92.47 mL/min (ref >60.00)
Glucose, Bld: 96 mg/dL (ref 70–99)
Potassium: 4.5 mEq/L (ref 3.5–5.1)
Sodium: 136 mEq/L (ref 135–145)
Total Bilirubin: 0.6 mg/dL (ref 0.2–1.2)
Total Protein: 6.8 g/dL (ref 6.0–8.3)

## 2022-05-10 LAB — CBC
HCT: 42.1 % (ref 39.0–52.0)
Hemoglobin: 14.2 g/dL (ref 13.0–17.0)
MCHC: 33.8 g/dL (ref 30.0–36.0)
MCV: 94.2 fl (ref 78.0–100.0)
Platelets: 232 10*3/uL (ref 150.0–400.0)
RBC: 4.48 Mil/uL (ref 4.22–5.81)
RDW: 13.5 % (ref 11.5–15.5)
WBC: 7.1 10*3/uL (ref 4.0–10.5)

## 2022-05-10 LAB — LIPID PANEL
Cholesterol: 178 mg/dL (ref 0–200)
HDL: 67.1 mg/dL (ref >39.00)
LDL Cholesterol: 97 mg/dL (ref 0–99)
NonHDL: 111.2
Total CHOL/HDL Ratio: 3
Triglycerides: 69 mg/dL (ref 0.0–149.0)
VLDL: 13.8 mg/dL (ref 0.0–40.0)

## 2022-05-10 LAB — HEMOGLOBIN A1C: Hgb A1c MFr Bld: 6 % (ref 4.6–6.5)
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
## 2022-05-10 NOTE — Patient Instructions (Addendum)
RSV vaccine is an option at your pharmacy. See info below, but let me know if there are other questions.   Let me know who you would like to see for second opinion on the prostate issues. Seeing a different provider at [facility] or Dr. Harlow Asa in [facility] are other options. Let me know if you need a referral.   There is a recommendation for RSV vaccine for patients over age 78.   Typically would recommend the RSV vaccine as most important for patients over age 25 that have comorbidities that put them at increased risk for severe disease (heart disease such as congestive heart failure, coronary artery disease, lung disease such as asthma or COPD, kidney disease, liver disease, diabetes, chronic or progressive neurologic or muscular conditions, immunosuppressed, or being frail or of advanced age).  For others that do not have these risk factors, there still is some benefit from vaccination since age is one of the main risk factors for developing severe disease however baseline risk of developing severe disease and requiring hospitalization is likely to be lower compared to those that have comorbidities in addition to age.   CDC does have some information as well: [place]  Hip pain can be a component of both trochanteric bursitis as well as lateral femoral cutaneous nerve syndrome or meralgia paresthetica.  Avoid constrictive clothing or tight belts/drawstrings.  That should help with the shock sensation in the front of the thigh.  Avoid direct pressure to the site of hip, but I will refer you to orthopedics to decide if injection may be needed.  Thanks for coming in today.  Let me know if there are questions.  Hip Bursitis  Hip bursitis is the swelling of one or more of the fluid-filled sacs (bursae) in the hip joint. The hip bursae absorb shocks and prevent bones from rubbing against each other. If a bursa becomes irritated, it can fill  with extra fluid and become inflamed. Hip bursitis can cause mild to moderate pain, and symptoms often come and go over time. What are the causes? This condition results from increased friction between the hip bones and the tendons around the hip joint. This condition can happen if you: Overuse your hip muscles. Injure your hip. Have weak buttocks muscles. Have bone spurs. Have an infection. In some cases, the cause may not be known. What increases the risk? You are more likely to develop this condition if: You injured your hip previously or had hip surgery. You have a medical condition, such as arthritis, gout, diabetes, or thyroid disease. You have spine problems. You have one leg that is shorter than the other. You participate in athletic activities that include repetitive motion, like running. You participate in sports where there is a risk of injury or falling, such as football, martial arts, or skiing. What are the signs or symptoms? Symptoms may come and go, and they often include: Pain in the hip or groin area. Pain may get worse with movement. Tenderness and swelling of the hip. In rare cases, the bursa may become infected. If this happens, you may get a fever, as well as warmth and redness in the hip area. How is this diagnosed? This condition may be diagnosed based on: Your symptoms. Your medical history. A physical exam. Imaging tests, such as: X-rays to check your bones. MRI or ultrasound to check your tendons and muscles. Bone scan. How is this treated? This condition is treated by resting, icing, applying pressure (compression), and raising (  elevating) the injured area. This is called RICE treatment. In some cases, RICE treatment may not be enough to make your symptoms go away. Treatment may also include: Using crutches, a cane, or a walker to decrease the strain on your hip. Taking medicine to help with swelling and pain. Getting a shot of cortisone medicine near  the affected area to reduce swelling and pain. Taking antibiotic medicines if there is an infection. Draining fluid out of the bursa to help relieve swelling and pain. Having surgery to remove a damaged or infected bursa. This is rare. Long-term treatment may include: Physical therapy exercises for strength and flexibility. Identifying the cause of your bursitis to prevent future episodes. Lifestyle changes, such as weight loss, to reduce the strain on the hip. Follow these instructions at home: Managing pain, stiffness, and swelling     If directed, put ice on the affected area. To do this: Put ice in a plastic bag. Place a towel between your skin and the bag. Leave the ice on for 20 minutes, 2-3 times a day. Remove the ice if your skin turns bright red. This is very important. If you cannot feel pain, heat, or cold, you have a greater risk of damage to the area. Elevate your hip as much as you can without feeling pain. To do this, put a pillow under your hips while you lie down. If directed, apply heat to the affected area as often as told by your health care provider. Use the heat source that your health care provider recommends, such as a moist heat pack or a heating pad. Place a towel between your skin and the heat source. Leave the heat on for 20-30 minutes. Remove the heat if your skin turns bright red. This is especially important if you are unable to feel pain, heat, or cold. You may have a greater risk of getting burned. Activity Do not use your hip to support your body weight until your health care provider says that you can. Use crutches, a cane, or a walker as told by your health care provider. If the affected leg is one that you use to drive, ask your health care provider if it is safe to drive. Rest and protect your hip as much as possible until your pain and swelling get better. Return to your normal activities as told by your health care provider. Ask your health care  provider what activities are safe for you. Do exercises as told by your health care provider. General instructions Take over-the-counter and prescription medicines only as told by your health care provider. Gently massage and stretch your injured area as often as is comfortable. Wear compression wraps only as told by your health care provider. If one of your legs is shorter than the other, get fitted for a shoe insert or orthotic. Your health care provider or physical therapist can tell you where to find these items and what size you need. Maintain a healthy weight. Follow instructions from your health care provider for weight control. These may include dietary restrictions. Keep all follow-up visits. This is important. How is this prevented? Exercise regularly or as told by your health care provider. Wear supportive footwear that is appropriate for your sport and daily activities. Warm up and stretch before being active. Cool down and stretch after being active. Take breaks regularly from repetitive activity. If an activity irritates your hip or causes pain, avoid the activity as much as possible. Avoid sitting down for long periods at  a time. Where to find more information American Academy of Orthopaedic Surgeons: orthoinfo.aaos.org Contact a health care provider if: You have a fever. You develop new symptoms. You have trouble walking or doing everyday activities. You have pain that gets worse or does not get better with medicine. You develop red skin or a feeling of warmth in your hip area. Get help right away if: You cannot move your hip. You have severe pain. You cannot control the muscles in your feet. Summary Hip bursitis is the swelling of one or more of the fluid-filled sacs (bursae) in the hip joint. Hip bursitis can cause hip or groin pain, and symptoms often come and go over time. This condition is often treated by resting, icing, applying pressure (compression), and  raising (elevating) the injured area. Other treatments may be needed. This information is not intended to replace advice given to you by your health care provider. Make sure you discuss any questions you have with your health care provider. Document Revised: 04/03/2021 Document Reviewed: 04/03/2021 Elsevier Patient Education  [place].   .

## 2022-05-22 ENCOUNTER — Telehealth: Payer: Self-pay | Admitting: Family Medicine

## 2022-05-22 NOTE — Telephone Encounter (Signed)
Left message for patient to call back and schedule Medicare Annual Wellness Visit (AWV) in office.   If not able to come in office, please offer to do virtually or by telephone.  Left office number and my jabber [phone].  Last AWV:05/09/2021   Please schedule at anytime with Nurse Health Advisor.

## 2022-07-06 IMAGING — RF DG C-ARM 1-60 MIN
1 series · 1 of 1 positions shown · non-contrast
Comparison: None.

CLINICAL DATA: Fracture

EXAM:
LEFT CLAVICLE - 2+ VIEWS; DG C-ARM 1-60 MIN

[Series 1: unknown protocol · 0.14mm/px · 1 of 1 slices shown]
[im 1/1]
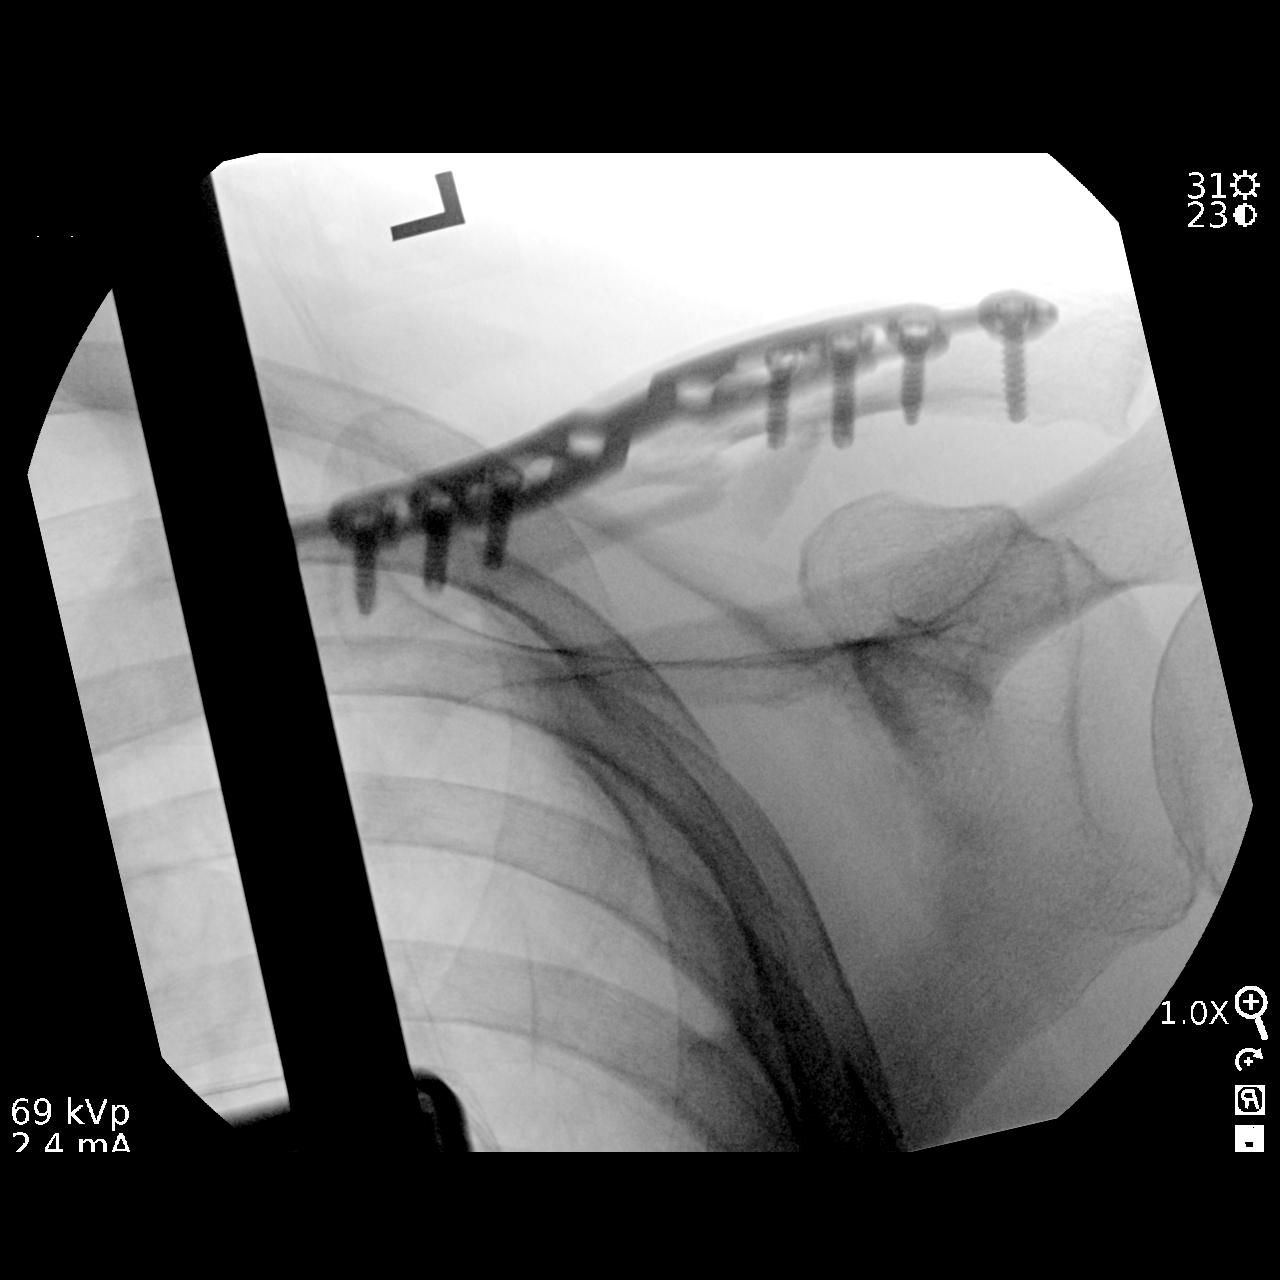

[1 of 1 positions shown; findings below may reference images not displayed]

FINDINGS: Single low resolution intraoperative spot view of left clavicle.
Total fluoroscopy time was 13 seconds. The image demonstrates
surgical plate and multiple screw fixation of comminuted mid left
clavicular fracture
IMPRESSION: Intraoperative fluoroscopic assistance provided during surgical
fixation of left clavicle fracture

## 2022-07-06 IMAGING — RF DG CLAVICLE*L*
1 series · 1 of 1 positions shown · non-contrast
Comparison: None.

CLINICAL DATA: Fracture

EXAM:
LEFT CLAVICLE - 2+ VIEWS; DG C-ARM 1-60 MIN

[Series 1: unknown protocol · 0.14mm/px · 1 of 1 slices shown]
[im 1/1]
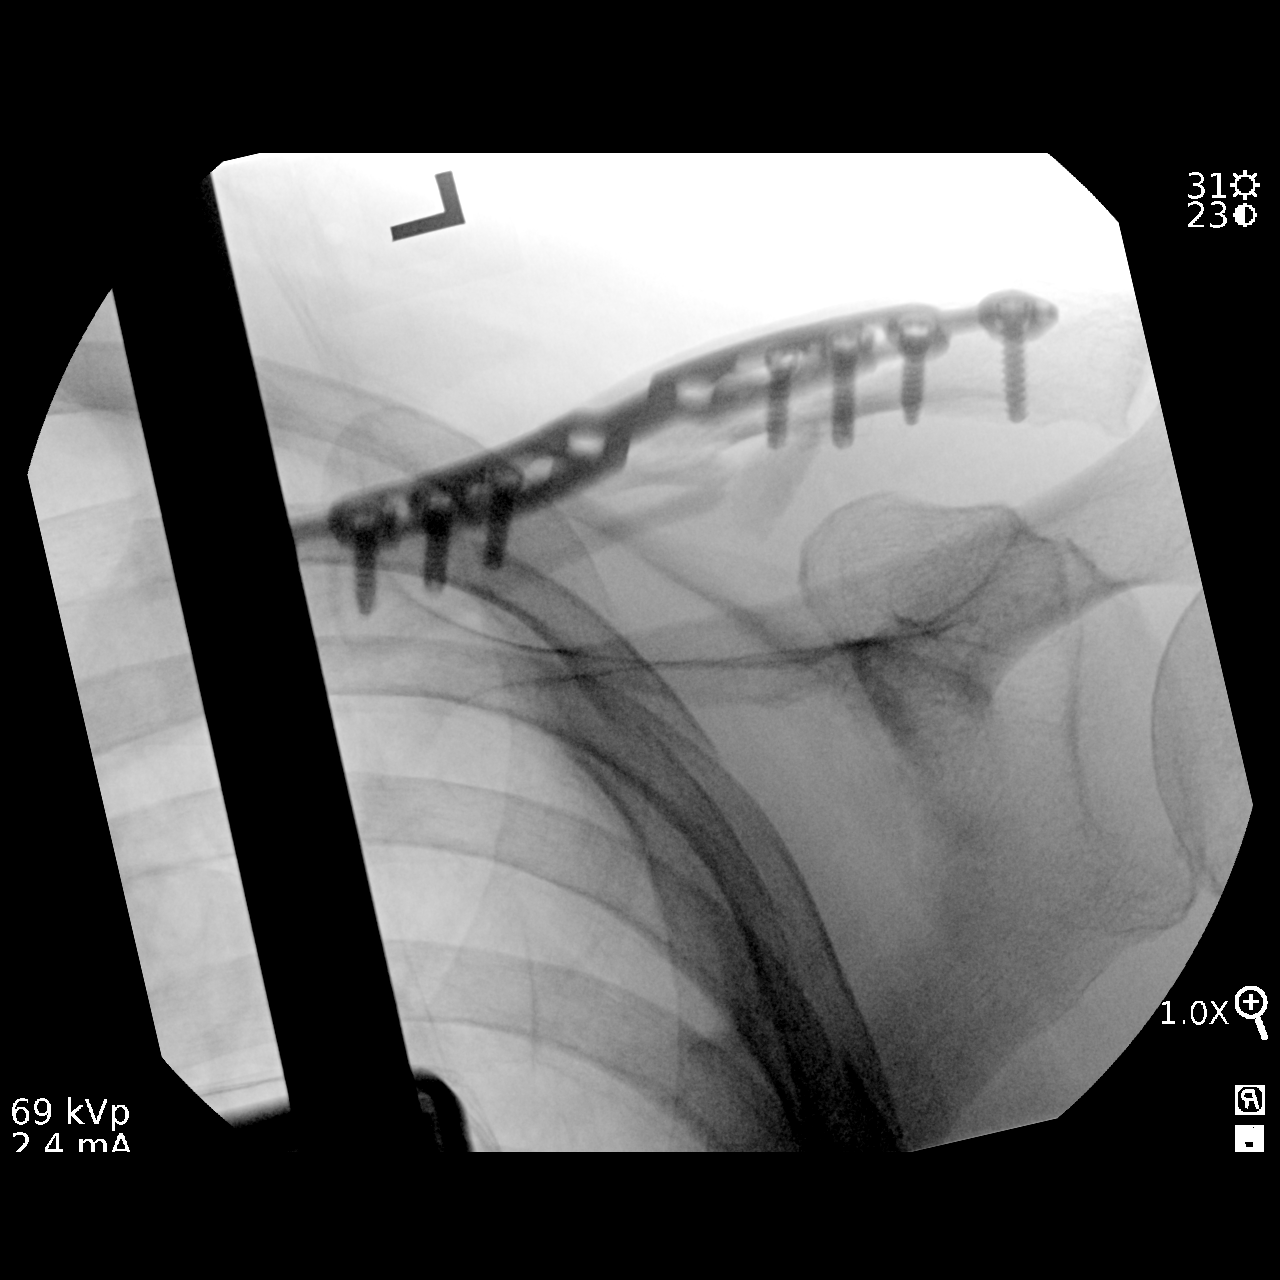

[1 of 1 positions shown; findings below may reference images not displayed]

FINDINGS: Single low resolution intraoperative spot view of left clavicle.
Total fluoroscopy time was 13 seconds. The image demonstrates
surgical plate and multiple screw fixation of comminuted mid left
clavicular fracture
IMPRESSION: Intraoperative fluoroscopic assistance provided during surgical
fixation of left clavicle fracture

## 2022-07-11 ENCOUNTER — Telehealth: Payer: Self-pay | Admitting: Family Medicine

## 2022-07-11 NOTE — Telephone Encounter (Signed)
Contacted Gordon Ellis to schedule their annual wellness visit. Appointment made for 07/17/2022.  Thank you,  Gordon Ellis Direct dial  [phone]

## 2022-07-17 ENCOUNTER — Ambulatory Visit (INDEPENDENT_AMBULATORY_CARE_PROVIDER_SITE_OTHER): Payer: Medicare Other | Admitting: Family Medicine

## 2022-07-17 DIAGNOSIS — Z0000 Encounter for general adult medical examination without abnormal findings: Secondary | ICD-10-CM | POA: Diagnosis not present

## 2022-07-17 NOTE — Patient Instructions (Signed)
[name] , Thank you for taking time to come for your Medicare Wellness Visit. I appreciate your ongoing commitment to your health goals. Please review the following plan we discussed and let me know if I can assist you in the future.   Screening recommendations/referrals: Colonoscopy: no longer required Recommended yearly ophthalmology/optometry visit for glaucoma screening and checkup Recommended yearly dental visit for hygiene and checkup  Vaccinations: Influenza vaccine: up to date Pneumococcal vaccine: up to date Tdap vaccine: up to date Shingles vaccine: up to date    Advanced directives: on file  Conditions/risks identified:     Preventive Care 51 Years and Older, Male Preventive care refers to lifestyle choices and visits with your health care provider that can promote health and wellness. What does preventive care include? A yearly physical exam. This is also called an annual well check. Dental exams once or twice a year. Routine eye exams. Ask your health care provider how often you should have your eyes checked. Personal lifestyle choices, including: Daily care of your teeth and gums. Regular physical activity. Eating a healthy diet. Avoiding tobacco and drug use. Limiting alcohol use. Practicing safe sex. Taking low doses of aspirin every day. Taking vitamin and mineral supplements as recommended by your health care provider. What happens during an annual well check? The services and screenings done by your health care provider during your annual well check will depend on your age, overall health, lifestyle risk factors, and family history of disease. Counseling  Your health care provider may ask you questions about your: Alcohol use. Tobacco use. Drug use. Emotional well-being. Home and relationship well-being. Sexual activity. Eating habits. History of falls. Memory and ability to understand (cognition). Work and work Statistician. Screening  You may  have the following tests or measurements: Height, weight, and BMI. Blood pressure. Lipid and cholesterol levels. These may be checked every 5 years, or more frequently if you are over 69 years old. Skin check. Lung cancer screening. You may have this screening every year starting at age 35 if you have a 30-pack-year history of smoking and currently smoke or have quit within the past 15 years. Fecal occult blood test (FOBT) of the stool. You may have this test every year starting at age 73. Flexible sigmoidoscopy or colonoscopy. You may have a sigmoidoscopy every 5 years or a colonoscopy every 10 years starting at age 36. Prostate cancer screening. Recommendations will vary depending on your family history and other risks. Hepatitis C blood test. Hepatitis B blood test. Sexually transmitted disease (STD) testing. Diabetes screening. This is done by checking your blood sugar (glucose) after you have not eaten for a while (fasting). You may have this done every 1-3 years. Abdominal aortic aneurysm (AAA) screening. You may need this if you are a current or former smoker. Osteoporosis. You may be screened starting at age 39 if you are at high risk. Talk with your health care provider about your test results, treatment options, and if necessary, the need for more tests. Vaccines  Your health care provider may recommend certain vaccines, such as: Influenza vaccine. This is recommended every year. Tetanus, diphtheria, and acellular pertussis (Tdap, Td) vaccine. You may need a Td booster every 10 years. Zoster vaccine. You may need this after age 52. Pneumococcal 13-valent conjugate (PCV13) vaccine. One dose is recommended after age 19. Pneumococcal polysaccharide (PPSV23) vaccine. One dose is recommended after age 97. Talk to your health care provider about which screenings and vaccines you need and how  often you need them. This information is not intended to replace advice given to you by your  health care provider. Make sure you discuss any questions you have with your health care provider. Document Released: 05/05/2015 Document Revised: 12/27/2015 Document Reviewed: 02/07/2015 Elsevier Interactive Patient Education  2017 [place] Prevention in the Home Falls can cause injuries. They can happen to people of all ages. There are many things you can do to make your home safe and to help prevent falls. What can I do on the outside of my home? Regularly fix the edges of walkways and driveways and fix any cracks. Remove anything that might make you trip as you walk through a door, such as a raised step or threshold. Trim any bushes or trees on the path to your home. Use bright outdoor lighting. Clear any walking paths of anything that might make someone trip, such as rocks or tools. Regularly check to see if handrails are loose or broken. Make sure that both sides of any steps have handrails. Any raised decks and porches should have guardrails on the edges. Have any leaves, snow, or ice cleared regularly. Use sand or salt on walking paths during winter. Clean up any spills in your garage right away. This includes oil or grease spills. What can I do in the bathroom? Use night lights. Install grab bars by the toilet and in the tub and shower. Do not use towel bars as grab bars. Use non-skid mats or decals in the tub or shower. If you need to sit down in the shower, use a plastic, non-slip stool. Keep the floor dry. Clean up any water that spills on the floor as soon as it happens. Remove soap buildup in the tub or shower regularly. Attach bath mats securely with double-sided non-slip rug tape. Do not have throw rugs and other things on the floor that can make you trip. What can I do in the bedroom? Use night lights. Make sure that you have a light by your bed that is easy to reach. Do not use any sheets or blankets that are too big for your bed. They should not hang down  onto the floor. Have a firm chair that has side arms. You can use this for support while you get dressed. Do not have throw rugs and other things on the floor that can make you trip. What can I do in the kitchen? Clean up any spills right away. Avoid walking on wet floors. Keep items that you use a lot in easy-to-reach places. If you need to reach something above you, use a strong step stool that has a grab bar. Keep electrical cords out of the way. Do not use floor polish or wax that makes floors slippery. If you must use wax, use non-skid floor wax. Do not have throw rugs and other things on the floor that can make you trip. What can I do with my stairs? Do not leave any items on the stairs. Make sure that there are handrails on both sides of the stairs and use them. Fix handrails that are broken or loose. Make sure that handrails are as long as the stairways. Check any carpeting to make sure that it is firmly attached to the stairs. Fix any carpet that is loose or worn. Avoid having throw rugs at the top or bottom of the stairs. If you do have throw rugs, attach them to the floor with carpet tape. Make sure that you have a  light switch at the top of the stairs and the bottom of the stairs. If you do not have them, ask someone to add them for you. What else can I do to help prevent falls? Wear shoes that: Do not have high heels. Have rubber bottoms. Are comfortable and fit you well. Are closed at the toe. Do not wear sandals. If you use a stepladder: Make sure that it is fully opened. Do not climb a closed stepladder. Make sure that both sides of the stepladder are locked into place. Ask someone to hold it for you, if possible. Clearly mark and make sure that you can see: Any grab bars or handrails. First and last steps. Where the edge of each step is. Use tools that help you move around (mobility aids) if they are needed. These include: Canes. Walkers. Scooters. Crutches. Turn  on the lights when you go into a dark area. Replace any light bulbs as soon as they burn out. Set up your furniture so you have a clear path. Avoid moving your furniture around. If any of your floors are uneven, fix them. If there are any pets around you, be aware of where they are. Review your medicines with your doctor. Some medicines can make you feel dizzy. This can increase your chance of falling. Ask your doctor what other things that you can do to help prevent falls. This information is not intended to replace advice given to you by your health care provider. Make sure you discuss any questions you have with your health care provider. Document Released: 02/02/2009 Document Revised: 09/14/2015 Document Reviewed: 05/13/2014 Elsevier Interactive Patient Education  2017 [employer].
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
## 2022-07-23 NOTE — Progress Notes (Signed)
Note reviewed, and agree with documentation and plan. I was available in office during the time of this wellness visit, or available by phone or text if needed and available for questions or concerns regarding visit or follow-up issues to be addressed.  Signed,   Merri Ray, MD [place] 07/23/22 9:06 PM

## 2023-05-12 ENCOUNTER — Encounter: Payer: Self-pay | Admitting: Family Medicine

## 2023-05-12 ENCOUNTER — Ambulatory Visit (INDEPENDENT_AMBULATORY_CARE_PROVIDER_SITE_OTHER): Payer: Medicare Other | Admitting: Family Medicine

## 2023-05-12 VITALS — BP 110/60 | HR 56 | Temp 97.8°F | Ht 75.0 in

## 2023-05-12 DIAGNOSIS — R351 Nocturia: Secondary | ICD-10-CM

## 2023-05-12 DIAGNOSIS — Z1329 Encounter for screening for other suspected endocrine disorder: Secondary | ICD-10-CM

## 2023-05-12 DIAGNOSIS — G4700 Insomnia, unspecified: Secondary | ICD-10-CM

## 2023-05-12 DIAGNOSIS — Z0000 Encounter for general adult medical examination without abnormal findings: Secondary | ICD-10-CM

## 2023-05-12 DIAGNOSIS — Z8349 Family history of other endocrine, nutritional and metabolic diseases: Secondary | ICD-10-CM | POA: Diagnosis not present

## 2023-05-12 DIAGNOSIS — R7303 Prediabetes: Secondary | ICD-10-CM | POA: Diagnosis not present

## 2023-05-12 DIAGNOSIS — Z23 Encounter for immunization: Secondary | ICD-10-CM | POA: Diagnosis not present

## 2023-05-12 DIAGNOSIS — R0683 Snoring: Secondary | ICD-10-CM

## 2023-05-12 LAB — LIPID PANEL
Cholesterol: 181 mg/dL (ref 0–200)
HDL: 71 mg/dL (ref >39.00)
LDL Cholesterol: 97 mg/dL (ref 0–99)
NonHDL: 110.07
Total CHOL/HDL Ratio: 3
Triglycerides: 64 mg/dL (ref 0.0–149.0)
VLDL: 12.8 mg/dL (ref 0.0–40.0)

## 2023-05-12 LAB — COMPREHENSIVE METABOLIC PANEL
ALT: 21 U/L (ref 0–53)
AST: 24 U/L (ref 0–37)
Albumin: 4.3 g/dL (ref 3.5–5.2)
Alkaline Phosphatase: 61 U/L (ref 39–117)
BUN: 11 mg/dL (ref 6–23)
CO2: 29 meq/L (ref 19–32)
Calcium: 8.9 mg/dL (ref 8.4–10.5)
Chloride: 100 meq/L (ref 96–112)
Creatinine, Ser: 0.52 mg/dL (ref 0.40–1.50)
GFR: 96.83 mL/min (ref >60.00)
Glucose, Bld: 90 mg/dL (ref 70–99)
Potassium: 4.3 meq/L (ref 3.5–5.1)
Sodium: 135 meq/L (ref 135–145)
Total Bilirubin: 0.6 mg/dL (ref 0.2–1.2)
Total Protein: 6.4 g/dL (ref 6.0–8.3)

## 2023-05-12 LAB — HEMOGLOBIN A1C: Hgb A1c MFr Bld: 6 % (ref 4.6–6.5)

## 2023-05-12 LAB — TSH: TSH: 1.15 u[IU]/mL (ref 0.35–5.50)
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
## 2023-05-12 NOTE — Patient Instructions (Signed)
Thanks for coming in today.  I will check some labs as well as refer you to sleep specialist as requested by urology.  Follow-up with Dr. Penni Bombard if you have any continued issues with your knees or I am happy to see you as well.  If any concerns on labs I will let you know.  Great work on the weight loss, expect labs to look better.  Take care!  Preventive Care 28 Years and Older, Male Preventive care refers to lifestyle choices and visits with your health care provider that can promote health and wellness. Preventive care visits are also called wellness exams. What can I expect for my preventive care visit? Counseling During your preventive care visit, your health care provider may ask about your: Medical history, including: Past medical problems. Family medical history. History of falls. Current health, including: Emotional well-being. Home life and relationship well-being. Sexual activity. Memory and ability to understand (cognition). Lifestyle, including: Alcohol, nicotine or tobacco, and drug use. Access to firearms. Diet, exercise, and sleep habits. Work and work Astronomer. Sunscreen use. Safety issues such as seatbelt and bike helmet use. Physical exam Your health care provider will check your: Height and weight. These may be used to calculate your BMI (body mass index). BMI is a measurement that tells if you are at a healthy weight. Waist circumference. This measures the distance around your waistline. This measurement also tells if you are at a healthy weight and may help predict your risk of certain diseases, such as type 2 diabetes and high blood pressure. Heart rate and blood pressure. Body temperature. Skin for abnormal spots. What immunizations do I need?  Vaccines are usually given at various ages, according to a schedule. Your health care provider will recommend vaccines for you based on your age, medical history, and lifestyle or other factors, such as travel or  where you work. What tests do I need? Screening Your health care provider may recommend screening tests for certain conditions. This may include: Lipid and cholesterol levels. Diabetes screening. This is done by checking your blood sugar (glucose) after you have not eaten for a while (fasting). Hepatitis C test. Hepatitis B test. HIV (human immunodeficiency virus) test. STI (sexually transmitted infection) testing, if you are at risk. Lung cancer screening. Colorectal cancer screening. Prostate cancer screening. Abdominal aortic aneurysm (AAA) screening. You may need this if you are a current or former smoker. Talk with your health care provider about your test results, treatment options, and if necessary, the need for more tests. Follow these instructions at home: Eating and drinking  Eat a diet that includes fresh fruits and vegetables, whole grains, lean protein, and low-fat dairy products. Limit your intake of foods with high amounts of sugar, saturated fats, and salt. Take vitamin and mineral supplements as recommended by your health care provider. Do not drink alcohol if your health care provider tells you not to drink. If you drink alcohol: Limit how much you have to 0-2 drinks a day. Know how much alcohol is in your drink. In the [place] one drink equals one 12 oz bottle of beer (355 mL), one 5 oz glass of wine (148 mL), or one 1 oz glass of hard liquor (44 mL). Lifestyle Brush your teeth every morning and night with fluoride toothpaste. Floss one time each day. Exercise for at least 30 minutes 5 or more days each week. Do not use any products that contain nicotine or tobacco. These products include cigarettes, chewing tobacco, and vaping devices,  such as e-cigarettes. If you need help quitting, ask your health care provider. Do not use drugs. If you are sexually active, practice safe sex. Use a condom or other form of protection to prevent STIs. Take aspirin only as told by your  health care provider. Make sure that you understand how much to take and what form to take. Work with your health care provider to find out whether it is safe and beneficial for you to take aspirin daily. Ask your health care provider if you need to take a cholesterol-lowering medicine (statin). Find healthy ways to manage stress, such as: Meditation, yoga, or listening to music. Journaling. Talking to a trusted person. Spending time with friends and family. Safety Always wear your seat belt while driving or riding in a vehicle. Do not drive: If you have been drinking alcohol. Do not ride with someone who has been drinking. When you are tired or distracted. While texting. If you have been using any mind-altering substances or drugs. Wear a helmet and other protective equipment during sports activities. If you have firearms in your house, make sure you follow all gun safety procedures. Minimize exposure to UV radiation to reduce your risk of skin cancer. What's next? Visit your health care provider once a year for an annual wellness visit. Ask your health care provider how often you should have your eyes and teeth checked. Stay up to date on all vaccines. This information is not intended to replace advice given to you by your health care provider. Make sure you discuss any questions you have with your health care provider. Document Revised: 10/04/2020 Document Reviewed: 10/04/2020 Elsevier Patient Education  2024 [employer].

## 2023-08-05 ENCOUNTER — Ambulatory Visit: Admitting: *Deleted

## 2023-08-05 DIAGNOSIS — Z0000 Encounter for general adult medical examination without abnormal findings: Secondary | ICD-10-CM
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
## 2023-08-05 NOTE — Patient Instructions (Signed)
 [name] , Thank you for taking time to come for your Medicare Wellness Visit. I appreciate your ongoing commitment to your health goals. Please review the following plan we discussed and let me know if I can assist you in the future.   Screening recommendations/referrals: Colonoscopy: no longer required Recommended yearly ophthalmology/optometry visit for glaucoma screening and checkup Recommended yearly dental visit for hygiene and checkup  Vaccinations: Influenza vaccine: up to date Pneumococcal vaccine: up to date Tdap vaccine: Education provided Shingles vaccine: up to date      Preventive Care 65 Years and Older, Male Preventive care refers to lifestyle choices and visits with your health care provider that can promote health and wellness. What does preventive care include? A yearly physical exam. This is also called an annual well check. Dental exams once or twice a year. Routine eye exams. Ask your health care provider how often you should have your eyes checked. Personal lifestyle choices, including: Daily care of your teeth and gums. Regular physical activity. Eating a healthy diet. Avoiding tobacco and drug use. Limiting alcohol use. Practicing safe sex. Taking low doses of aspirin every day. Taking vitamin and mineral supplements as recommended by your health care provider. What happens during an annual well check? The services and screenings done by your health care provider during your annual well check will depend on your age, overall health, lifestyle risk factors, and family history of disease. Counseling  Your health care provider may ask you questions about your: Alcohol use. Tobacco use. Drug use. Emotional well-being. Home and relationship well-being. Sexual activity. Eating habits. History of falls. Memory and ability to understand (cognition). Work and work Astronomer. Screening  You may have the following tests or measurements: Height,  weight, and BMI. Blood pressure. Lipid and cholesterol levels. These may be checked every 5 years, or more frequently if you are over 40 years old. Skin check. Lung cancer screening. You may have this screening every year starting at age 92 if you have a 30-pack-year history of smoking and currently smoke or have quit within the past 15 years. Fecal occult blood test (FOBT) of the stool. You may have this test every year starting at age 24. Flexible sigmoidoscopy or colonoscopy. You may have a sigmoidoscopy every 5 years or a colonoscopy every 10 years starting at age 77. Prostate cancer screening. Recommendations will vary depending on your family history and other risks. Hepatitis C blood test. Hepatitis B blood test. Sexually transmitted disease (STD) testing. Diabetes screening. This is done by checking your blood sugar (glucose) after you have not eaten for a while (fasting). You may have this done every 1-3 years. Abdominal aortic aneurysm (AAA) screening. You may need this if you are a current or former smoker. Osteoporosis. You may be screened starting at age 17 if you are at high risk. Talk with your health care provider about your test results, treatment options, and if necessary, the need for more tests. Vaccines  Your health care provider may recommend certain vaccines, such as: Influenza vaccine. This is recommended every year. Tetanus, diphtheria, and acellular pertussis (Tdap, Td) vaccine. You may need a Td booster every 10 years. Zoster vaccine. You may need this after age 62. Pneumococcal 13-valent conjugate (PCV13) vaccine. One dose is recommended after age 64. Pneumococcal polysaccharide (PPSV23) vaccine. One dose is recommended after age 55. Talk to your health care provider about which screenings and vaccines you need and how often you need them. This information is not intended to  replace advice given to you by your health care provider. Make sure you discuss any  questions you have with your health care provider. Document Released: 05/05/2015 Document Revised: 12/27/2015 Document Reviewed: 02/07/2015 Elsevier Interactive Patient Education  2017 [employer].  Fall Prevention in the Home Falls can cause injuries. They can happen to people of all ages. There are many things you can do to make your home safe and to help prevent falls. What can I do on the outside of my home? Regularly fix the edges of walkways and driveways and fix any cracks. Remove anything that might make you trip as you walk through a door, such as a raised step or threshold. Trim any bushes or trees on the path to your home. Use bright outdoor lighting. Clear any walking paths of anything that might make someone trip, such as rocks or tools. Regularly check to see if handrails are loose or broken. Make sure that both sides of any steps have handrails. Any raised decks and porches should have guardrails on the edges. Have any leaves, snow, or ice cleared regularly. Use sand or salt on walking paths during winter. Clean up any spills in your garage right away. This includes oil or grease spills. What can I do in the bathroom? Use night lights. Install grab bars by the toilet and in the tub and shower. Do not use towel bars as grab bars. Use non-skid mats or decals in the tub or shower. If you need to sit down in the shower, use a plastic, non-slip stool. Keep the floor dry. Clean up any water that spills on the floor as soon as it happens. Remove soap buildup in the tub or shower regularly. Attach bath mats securely with double-sided non-slip rug tape. Do not have throw rugs and other things on the floor that can make you trip. What can I do in the bedroom? Use night lights. Make sure that you have a light by your bed that is easy to reach. Do not use any sheets or blankets that are too big for your bed. They should not hang down onto the floor. Have a firm chair that has side  arms. You can use this for support while you get dressed. Do not have throw rugs and other things on the floor that can make you trip. What can I do in the kitchen? Clean up any spills right away. Avoid walking on wet floors. Keep items that you use a lot in easy-to-reach places. If you need to reach something above you, use a strong step stool that has a grab bar. Keep electrical cords out of the way. Do not use floor polish or wax that makes floors slippery. If you must use wax, use non-skid floor wax. Do not have throw rugs and other things on the floor that can make you trip. What can I do with my stairs? Do not leave any items on the stairs. Make sure that there are handrails on both sides of the stairs and use them. Fix handrails that are broken or loose. Make sure that handrails are as long as the stairways. Check any carpeting to make sure that it is firmly attached to the stairs. Fix any carpet that is loose or worn. Avoid having throw rugs at the top or bottom of the stairs. If you do have throw rugs, attach them to the floor with carpet tape. Make sure that you have a light switch at the top of the stairs and the  bottom of the stairs. If you do not have them, ask someone to add them for you. What else can I do to help prevent falls? Wear shoes that: Do not have high heels. Have rubber bottoms. Are comfortable and fit you well. Are closed at the toe. Do not wear sandals. If you use a stepladder: Make sure that it is fully opened. Do not climb a closed stepladder. Make sure that both sides of the stepladder are locked into place. Ask someone to hold it for you, if possible. Clearly mark and make sure that you can see: Any grab bars or handrails. First and last steps. Where the edge of each step is. Use tools that help you move around (mobility aids) if they are needed. These include: Canes. Walkers. Scooters. Crutches. Turn on the lights when you go into a dark area.  Replace any light bulbs as soon as they burn out. Set up your furniture so you have a clear path. Avoid moving your furniture around. If any of your floors are uneven, fix them. If there are any pets around you, be aware of where they are. Review your medicines with your doctor. Some medicines can make you feel dizzy. This can increase your chance of falling. Ask your doctor what other things that you can do to help prevent falls. This information is not intended to replace advice given to you by your health care provider. Make sure you discuss any questions you have with your health care provider. Document Released: 02/02/2009 Document Revised: 09/14/2015 Document Reviewed: 05/13/2014 Elsevier Interactive Patient Education  2017 [employer].

## 2023-12-09 DIAGNOSIS — G5622 Lesion of ulnar nerve, left upper limb: Secondary | ICD-10-CM | POA: Insufficient documentation

## 2023-12-17 DIAGNOSIS — G5602 Carpal tunnel syndrome, left upper limb: Secondary | ICD-10-CM | POA: Insufficient documentation

## 2023-12-24 ENCOUNTER — Ambulatory Visit: Admitting: Family Medicine

## 2023-12-24 VITALS — BP 124/58 | HR 64 | Temp 97.8°F | Resp 16 | Ht 75.0 in | Wt 211.6 lb

## 2023-12-24 DIAGNOSIS — M25561 Pain in right knee: Secondary | ICD-10-CM | POA: Diagnosis not present

## 2023-12-24 DIAGNOSIS — M25562 Pain in left knee: Secondary | ICD-10-CM

## 2023-12-24 DIAGNOSIS — Z2989 Encounter for other specified prophylactic measures: Secondary | ICD-10-CM

## 2023-12-24 DIAGNOSIS — Z23 Encounter for immunization: Secondary | ICD-10-CM

## 2023-12-24 MED ORDER — ACETAZOLAMIDE 125 MG PO TABS: 125.0000 mg | ORAL_TABLET | Freq: Two times a day (BID) | ORAL | 0 refills | Status: AC

## 2023-12-24 NOTE — Patient Instructions (Signed)
 With previous surgery it may be best to have Ortho evaluate the knee pain and decide if injection is needed.  They can also check imaging if needed.  [facility].  Okay to use Voltaren  gel few times per day if needed for now.  Acetazolamide  ordered for prevention of high-altitude illness.  Make sure to stay hydrated.  Can start day prior to travel to higher altitude and continue for 2 to 3 days at higher altitude, twice per day.  If any symptoms of high-altitude illness, you still will need to be seen, evaluated by medical provider.  Please let me know if there are questions and have a great trip.  Stay safe
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
## 2023-12-25 ENCOUNTER — Encounter: Payer: Self-pay | Admitting: Family Medicine

## 2023-12-26 DIAGNOSIS — M25561 Pain in right knee: Secondary | ICD-10-CM | POA: Insufficient documentation

## 2024-01-27 DIAGNOSIS — M170 Bilateral primary osteoarthritis of knee: Secondary | ICD-10-CM | POA: Insufficient documentation

## 2024-01-27 DIAGNOSIS — M179 Osteoarthritis of knee, unspecified: Secondary | ICD-10-CM | POA: Insufficient documentation

## 2024-03-04 DIAGNOSIS — M25522 Pain in left elbow: Secondary | ICD-10-CM | POA: Insufficient documentation

## 2024-05-11 DIAGNOSIS — R399 Unspecified symptoms and signs involving the genitourinary system: Secondary | ICD-10-CM | POA: Insufficient documentation

## 2024-05-12 ENCOUNTER — Ambulatory Visit (INDEPENDENT_AMBULATORY_CARE_PROVIDER_SITE_OTHER): Payer: Medicare Other | Admitting: Family Medicine

## 2024-05-12 ENCOUNTER — Encounter: Payer: Self-pay | Admitting: Family Medicine

## 2024-05-12 VITALS — BP 110/66 | HR 72 | Temp 97.6°F | Resp 16 | Ht 75.0 in | Wt 215.6 lb

## 2024-05-12 DIAGNOSIS — R7303 Prediabetes: Secondary | ICD-10-CM

## 2024-05-12 DIAGNOSIS — Z0000 Encounter for general adult medical examination without abnormal findings: Secondary | ICD-10-CM

## 2024-05-12 LAB — COMPREHENSIVE METABOLIC PANEL WITH GFR
ALT: 17 U/L (ref 3–53)
AST: 22 U/L (ref 5–37)
Albumin: 4.3 g/dL (ref 3.5–5.2)
Alkaline Phosphatase: 75 U/L (ref 39–117)
BUN: 12 mg/dL (ref 6–23)
CO2: 30 meq/L (ref 19–32)
Calcium: 9.2 mg/dL (ref 8.4–10.5)
Chloride: 99 meq/L (ref 96–112)
Creatinine, Ser: 0.61 mg/dL (ref 0.40–1.50)
GFR: 91.62 mL/min
Glucose, Bld: 85 mg/dL (ref 70–99)
Potassium: 4.7 meq/L (ref 3.5–5.1)
Sodium: 134 meq/L — ABNORMAL LOW (ref 135–145)
Total Bilirubin: 0.7 mg/dL (ref 0.2–1.2)
Total Protein: 6.5 g/dL (ref 6.0–8.3)

## 2024-05-12 LAB — LIPID PANEL
Cholesterol: 179 mg/dL (ref 28–200)
HDL: 67.3 mg/dL
LDL Cholesterol: 99 mg/dL (ref 10–99)
NonHDL: 112.18
Total CHOL/HDL Ratio: 3
Triglycerides: 68 mg/dL (ref 10.0–149.0)
VLDL: 13.6 mg/dL (ref 0.0–40.0)

## 2024-05-12 LAB — HEMOGLOBIN A1C: Hgb A1c MFr Bld: 5.8 % (ref 4.6–6.5)
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
## 2024-05-12 NOTE — Patient Instructions (Signed)
 Thank you for coming in today. No change in medications at this time. If there are any concerns on your bloodwork, I will let you know. Take care!  Preventive Care 23 Years and Older, Male Preventive care refers to lifestyle choices and visits with your health care provider that can promote health and wellness. Preventive care visits are also called wellness exams. What can I expect for my preventive care visit? Counseling During your preventive care visit, your health care provider may ask about your: Medical history, including: Past medical problems. Family medical history. History of falls. Current health, including: Emotional well-being. Home life and relationship well-being. Sexual activity. Memory and ability to understand (cognition). Lifestyle, including: Alcohol, nicotine or tobacco, and drug use. Access to firearms. Diet, exercise, and sleep habits. Work and work Astronomer. Sunscreen use. Safety issues such as seatbelt and bike helmet use. Physical exam Your health care provider will check your: Height and weight. These may be used to calculate your BMI (body mass index). BMI is a measurement that tells if you are at a healthy weight. Waist circumference. This measures the distance around your waistline. This measurement also tells if you are at a healthy weight and may help predict your risk of certain diseases, such as type 2 diabetes and high blood pressure. Heart rate and blood pressure. Body temperature. Skin for abnormal spots. What immunizations do I need?  Vaccines are usually given at various ages, according to a schedule. Your health care provider will recommend vaccines for you based on your age, medical history, and lifestyle or other factors, such as travel or where you work. What tests do I need? Screening Your health care provider may recommend screening tests for certain conditions. This may include: Lipid and cholesterol levels. Diabetes screening.  This is done by checking your blood sugar (glucose) after you have not eaten for a while (fasting). Hepatitis C test. Hepatitis B test. HIV (human immunodeficiency virus) test. STI (sexually transmitted infection) testing, if you are at risk. Lung cancer screening. Colorectal cancer screening. Prostate cancer screening. Abdominal aortic aneurysm (AAA) screening. You may need this if you are a current or former smoker. Talk with your health care provider about your test results, treatment options, and if necessary, the need for more tests. Follow these instructions at home: Eating and drinking  Eat a diet that includes fresh fruits and vegetables, whole grains, lean protein, and low-fat dairy products. Limit your intake of foods with high amounts of sugar, saturated fats, and salt. Take vitamin and mineral supplements as recommended by your health care provider. Do not drink alcohol if your health care provider tells you not to drink. If you drink alcohol: Limit how much you have to 0-2 drinks a day. Know how much alcohol is in your drink. In the [place] one drink equals one 12 oz bottle of beer (355 mL), one 5 oz glass of wine (148 mL), or one 1 oz glass of hard liquor (44 mL). Lifestyle Brush your teeth every morning and night with fluoride toothpaste. Floss one time each day. Exercise for at least 30 minutes 5 or more days each week. Do not use any products that contain nicotine or tobacco. These products include cigarettes, chewing tobacco, and vaping devices, such as e-cigarettes. If you need help quitting, ask your health care provider. Do not use drugs. If you are sexually active, practice safe sex. Use a condom or other form of protection to prevent STIs. Take aspirin only as told by your health  care provider. Make sure that you understand how much to take and what form to take. Work with your health care provider to find out whether it is safe and beneficial for you to take aspirin  daily. Ask your health care provider if you need to take a cholesterol-lowering medicine (statin). Find healthy ways to manage stress, such as: Meditation, yoga, or listening to music. Journaling. Talking to a trusted person. Spending time with friends and family. Safety Always wear your seat belt while driving or riding in a vehicle. Do not drive: If you have been drinking alcohol. Do not ride with someone who has been drinking. When you are tired or distracted. While texting. If you have been using any mind-altering substances or drugs. Wear a helmet and other protective equipment during sports activities. If you have firearms in your house, make sure you follow all gun safety procedures. Minimize exposure to UV radiation to reduce your risk of skin cancer. What's next? Visit your health care provider once a year for an annual wellness visit. Ask your health care provider how often you should have your eyes and teeth checked. Stay up to date on all vaccines. This information is not intended to replace advice given to you by your health care provider. Make sure you discuss any questions you have with your health care provider. Document Revised: 10/04/2020 Document Reviewed: 10/04/2020 Elsevier Patient Education  2024 [employer].

## 2024-05-15 ENCOUNTER — Ambulatory Visit: Payer: Self-pay | Admitting: Family Medicine

## 2024-05-17 NOTE — Telephone Encounter (Signed)
 Copied from CRM #[id number]. Topic: Clinical - Lab/Test Results >> May 17, 2024 11:36 AM Chasity T wrote: Reason for CRM: Patient called to go over his recent lab results. I did relay the message. He is requesting for the lab results to be sent in the mail to the address on file for his personal records.

## 2024-05-18 NOTE — Progress Notes (Signed)
 Sent lab letter

## 2024-08-10 ENCOUNTER — Encounter

## 2025-05-13 ENCOUNTER — Encounter: Admitting: Family Medicine
